# Patient Record
Sex: Male | Born: 1960
Health system: Southern US, Community
[De-identification: ages and names within clinical notes are randomized; demographics above are authoritative.]

## PROBLEM LIST (undated history)

## (undated) DIAGNOSIS — K5792 Diverticulitis of intestine, part unspecified, without perforation or abscess without bleeding: Secondary | ICD-10-CM

## (undated) HISTORY — PX: OTHER SURGICAL HISTORY: SHX169

---

## 2007-05-13 ENCOUNTER — Encounter: Admission: RE | Admit: 2007-05-13 | Discharge: 2007-05-13 | Payer: Self-pay | Admitting: General Surgery

## 2007-06-02 ENCOUNTER — Inpatient Hospital Stay (HOSPITAL_COMMUNITY): Admission: EM | Admit: 2007-06-02 | Discharge: 2007-06-04 | Payer: Self-pay | Admitting: Emergency Medicine

## 2009-04-29 IMAGING — CT CT PELVIS W/ CM
2 of 5 series · 17 of 46 positions shown, 19 images · IV contrast (READICAT/WATER & [ID] OMNI 300)
Comparison: none

Clinical data colon diverticulitis

CT abdomen with contrast:
Multidetector helical CT after 125  ml Bmnipaque-4JJ IV.
No previous for comparison. Minimal dependent atelectasis in the visualized lung
bases. Unremarkable liver, nondistended gallbladder, spleen, adrenal glands,
kidneys, pancreas, small bowel. No free air. No ascites. Portal vein patent.

[Series 3: routine abdomen · axial · 0.98mm/px · z∈[-435,+20]mm · 14 of 102 slices shown, 16 images]
[im 6/102  soft-tissue]
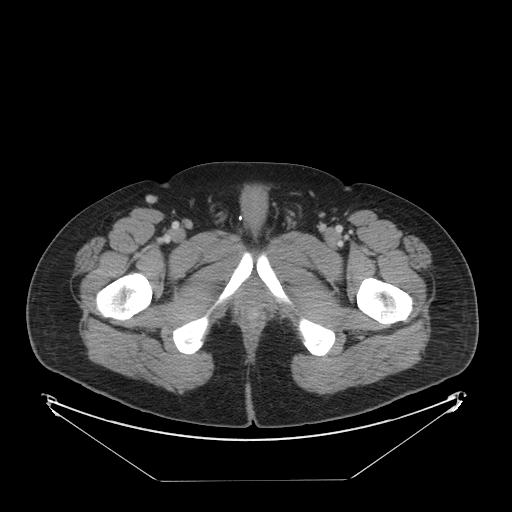
[im 6/102  bone]
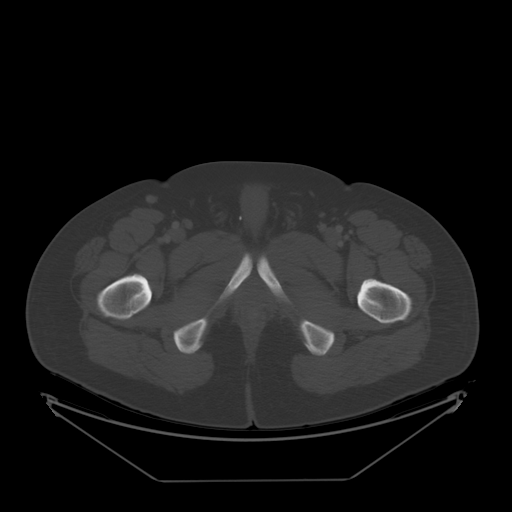
[im 11/102  soft-tissue]
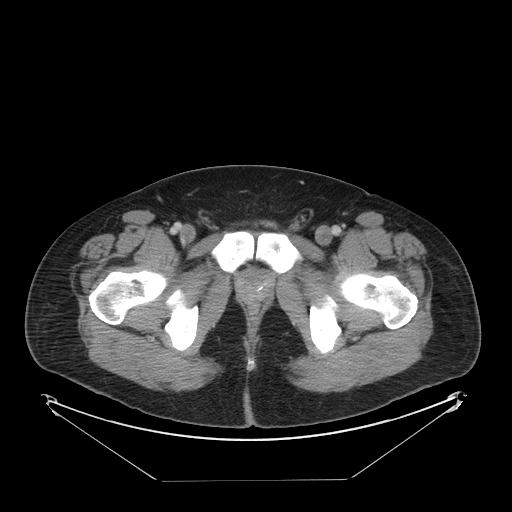
[im 22/102  soft-tissue]
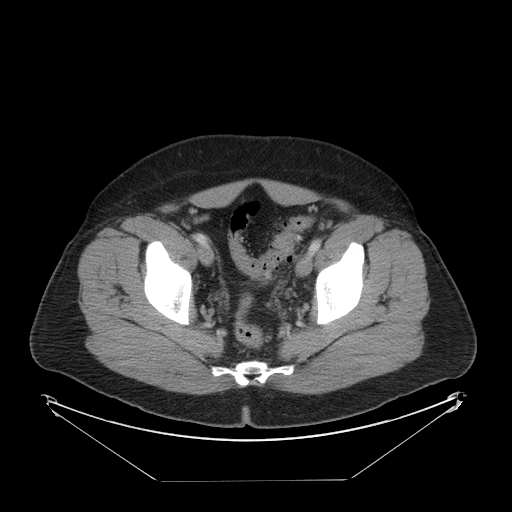
[im 27/102  soft-tissue]
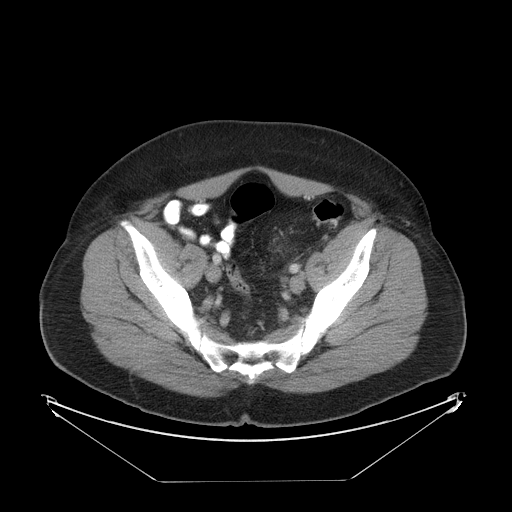
[im 32/102  soft-tissue]
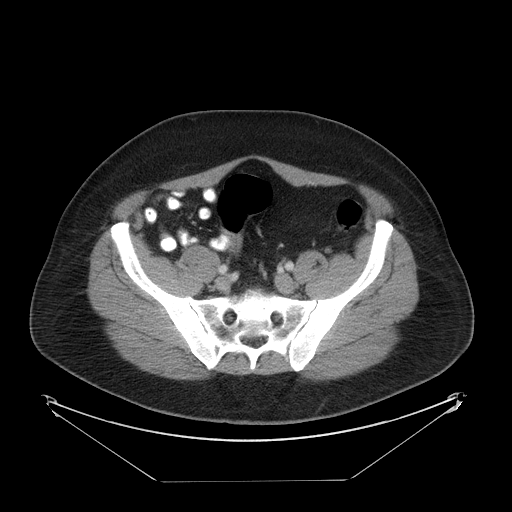
[im 43/102  soft-tissue]
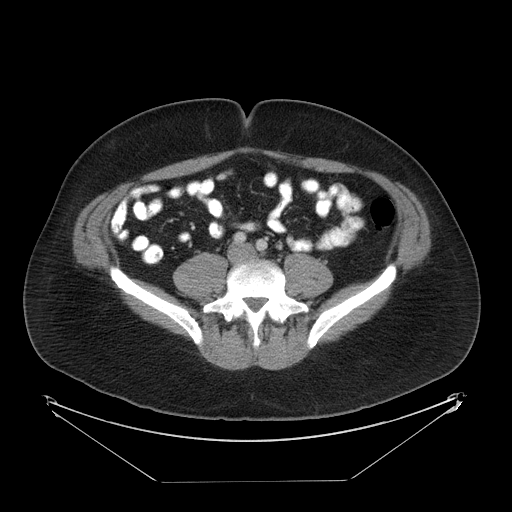
[im 48/102  soft-tissue]
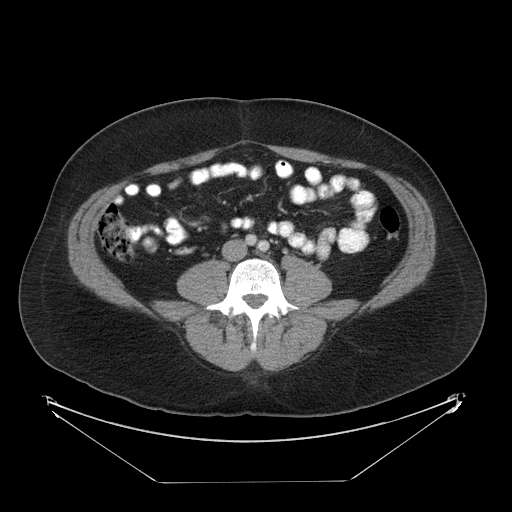
[im 54/102  soft-tissue]
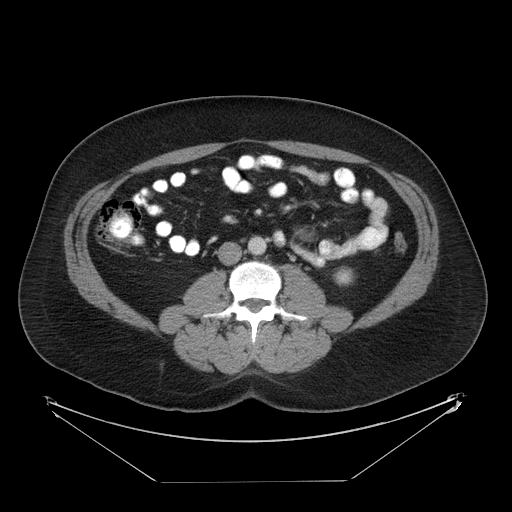
[im 59/102  soft-tissue]
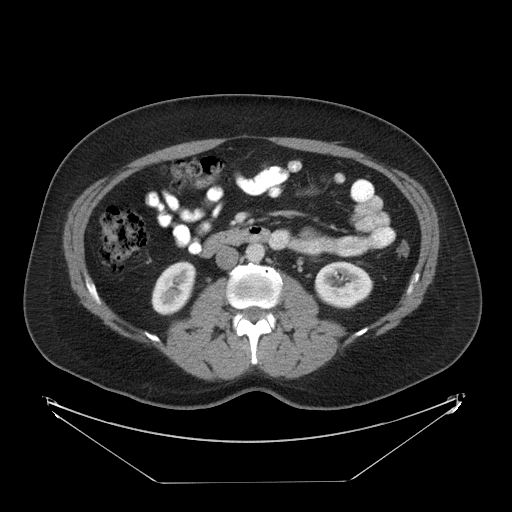
[im 59/102  bone]
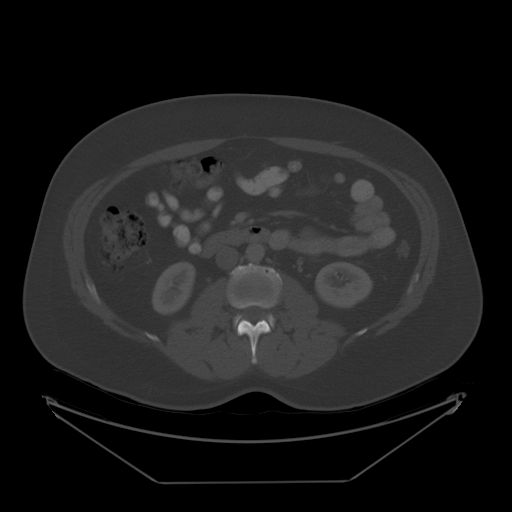
[im 70/102  soft-tissue]
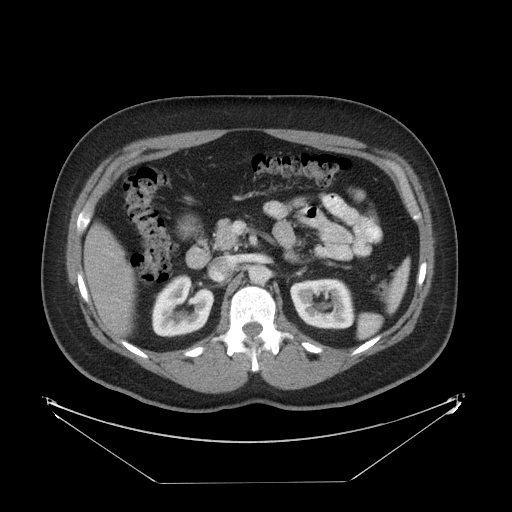
[im 75/102  soft-tissue]
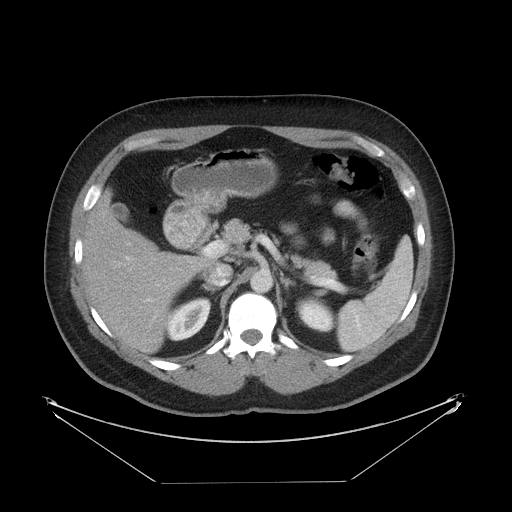
[im 80/102  soft-tissue]
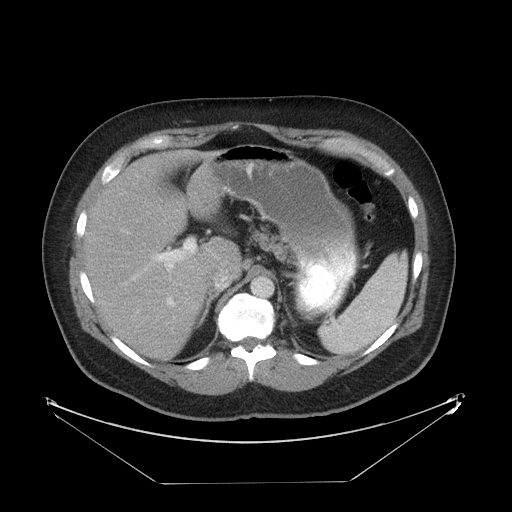
[im 91/102  soft-tissue]
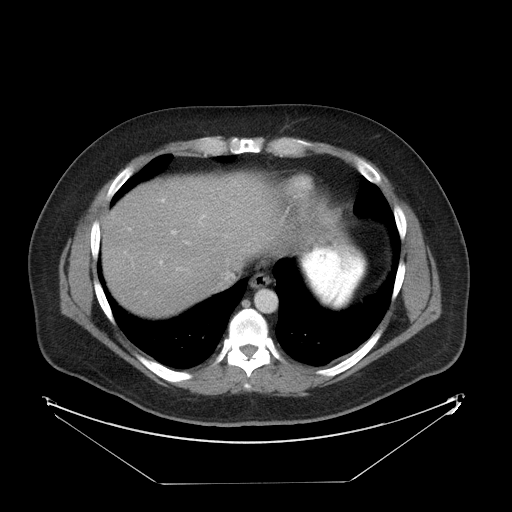
[im 96/102  soft-tissue]
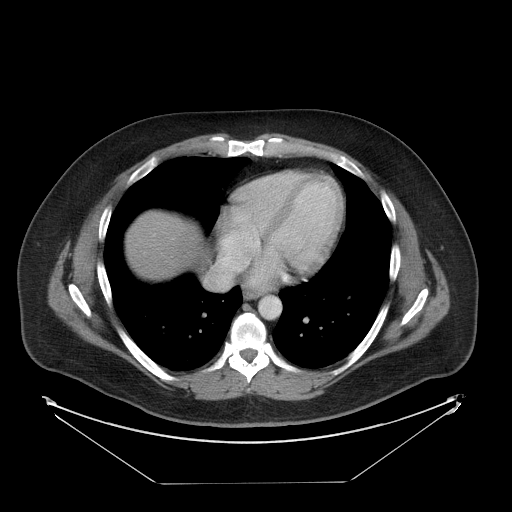

[Series 602: sagittal body · sagittal · 1.01mm/px · 3 of 190 slices shown]
[im 64/190  soft-tissue]
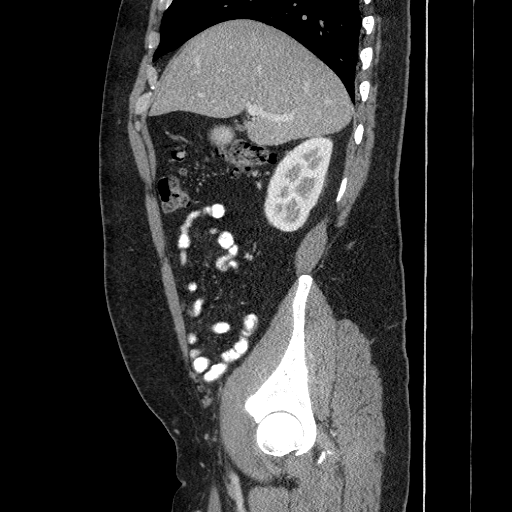
[im 85/190  soft-tissue]
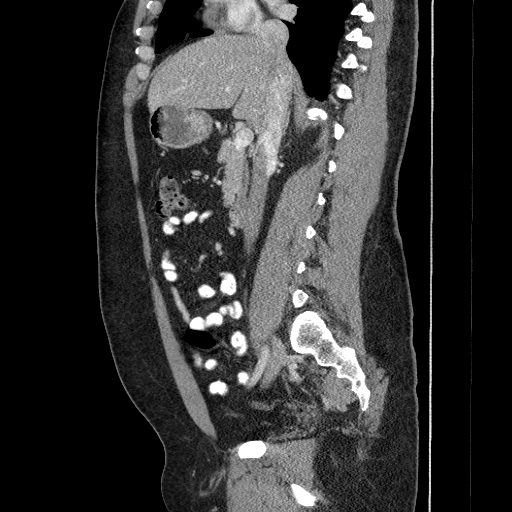
[im 106/190  soft-tissue]
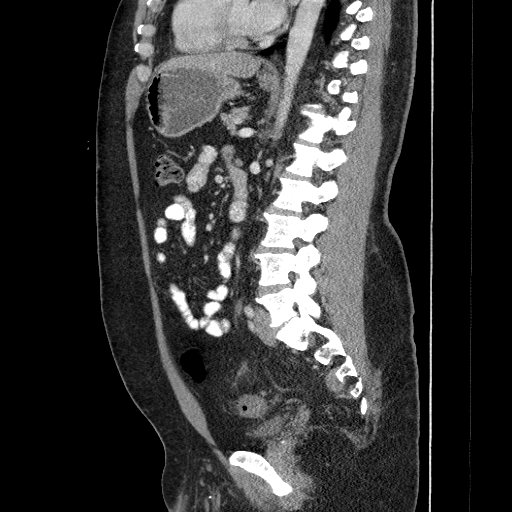

[17 of 46 positions shown; findings below may reference images not displayed]

IMPRESSION: 1. Unremarkable CT abdomen

CT pelvis with contrast:

Normal appendix.  The colon is nondilated. Scattered descending and sigmoid
diverticula. Mild inflammatory/edematous changes noted adjacent to the proximal
sigmoid colon. No extraluminal fluid collection to suggest abscess. No free
fluid. Urinary bladder incompletely distended. Bilateral pelvic phleboliths.
IMPRESSION: 1. Mild sigmoid diverticulitis without abscess.

## 2010-02-12 ENCOUNTER — Encounter: Admission: RE | Admit: 2010-02-12 | Discharge: 2010-02-12 | Payer: Self-pay | Admitting: Gastroenterology

## 2010-02-23 ENCOUNTER — Ambulatory Visit: Payer: Self-pay | Admitting: Internal Medicine

## 2010-02-23 DIAGNOSIS — M79609 Pain in unspecified limb: Secondary | ICD-10-CM | POA: Insufficient documentation

## 2010-02-23 DIAGNOSIS — M25519 Pain in unspecified shoulder: Secondary | ICD-10-CM | POA: Insufficient documentation

## 2010-02-23 DIAGNOSIS — K5730 Diverticulosis of large intestine without perforation or abscess without bleeding: Secondary | ICD-10-CM | POA: Insufficient documentation

## 2010-02-23 DIAGNOSIS — E669 Obesity, unspecified: Secondary | ICD-10-CM | POA: Insufficient documentation

## 2010-02-23 LAB — CONVERTED CEMR LAB

## 2010-02-26 ENCOUNTER — Ambulatory Visit: Payer: Self-pay | Admitting: Internal Medicine

## 2010-02-26 LAB — CONVERTED CEMR LAB
ALT: 24 units/L (ref 0–53)
Chloride: 106 meq/L (ref 96–112)
Cholesterol: 165 mg/dL (ref 0–200)
HDL: 41.6 mg/dL (ref >39.00)
LDL Cholesterol: 114 mg/dL — ABNORMAL HIGH (ref 0–99)
PSA: 0.45 ng/mL (ref 0.10–4.00)
Potassium: 4.2 meq/L (ref 3.5–5.1)
Sodium: 138 meq/L (ref 135–145)
TSH: 1.09 microintl units/mL (ref 0.35–5.50)
Total CHOL/HDL Ratio: 4
Triglycerides: 47 mg/dL (ref 0.0–149.0)

## 2010-07-29 ENCOUNTER — Encounter: Payer: Self-pay | Admitting: General Surgery

## 2010-08-07 NOTE — Assessment & Plan Note (Signed)
Summary: NEW PT TO EST/CLE   Vital Signs:  Patient profile:   50 year old male Height:      72 inches Weight:      267.75 pounds BMI:     36.44 Temp:     98.3 degrees F oral Pulse rate:   72 / minute Pulse rhythm:   regular BP sitting:   118 / 84  (left arm) Cuff size:   large  Vitals Entered By: Selena Batten Dance CMA Duncan Dull) (February 23, 2010 10:28 AM) CC: New pt. to establish care   History of Present Illness: CC: establish and shoulder pain  1. ?arthiritis - bilateral shoulder pain and left thumb pain.  Left thumb with nodule that was painful to touch and swollen.  Never red and warm.  Since abx feels joints better but has also taken it easy last few weeks.  No other joints involved.  2. diverticulitis - Dr. Kinnie Scales (research study group on diverticulitis).  Dr. Marguarite Arbour ([facility]).  recently had flareup 2 wks ago s/p cipro and flagyl and oxycodone.  feeling better.  Holding off on surgery for now.  scheduled for colonoscopy 04/2010.  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  Has been bitten x 3 this year by ticks.  Last one 60 days ago.  Doubts any on for longer than 24 hours.  No HA, fevers, chills, abd pain besides diverticulitis.  No muscle pains or rashes.  Never had PCP before, due for blood work.  declines DRE today.  has been checked in past.  thinks due for PSA.  Wife works at [employer].  Preventive Screening-Counseling & Management  Alcohol-Tobacco     Alcohol drinks/day: <1     Smoking Status: quit > 6 months  Caffeine-Diet-Exercise     Caffeine use/day: 2 cups coffee      Drug Use:  never.        Blood Transfusions:  no.    Colonoscopy  Procedure date:  07/23/2007  Findings:       Results: Diverticulosis and diverticulitis.  receives colonoscopies every 2 years in this study group on diverticulitis under Dr. Kinnie Scales with [facility].         Procedures Next Due Date:    Colonoscopy: 07/2009  -  Date:  02/19/2002    TD booster done per  pt  Current Medications (verified): 1)  Glucosamine-Chondroitin-Vit D3 1500-1200-800 Mg-Mg-Unit Pack (Glucosamine-Chondroitin-Vit D3) .... Once Daily or As Needed Arthritis  Allergies (verified): No Known Drug Allergies  Past History:  Past Medical History: Diverticulitis (in study group out of [facility] Dr. Kinnie Scales) Arthralgias h/o R RTC partial tear (1994) h/o L shoulder injury (2004)  Past Surgical History: T&A 1980s for sleep apnea (better since) Knee surgery R x 2 (1993)  Colonoscopy 07/2007 (diverticulitis/diverticulosis) (gets every 2 years)  Family History: Mother - BRCA 53yo (deceased) Uncles with prostate cancer GMA - emphysema ([employer]) 95s (deceased)  No HTN, DM, CA, CAD/MI, CVA  Social History: quit smoking occ EtOH, no rec drugs Occupation: Geophysical data processor Lives with wife and 2 dogs Grown children (3)Smoking Status:  quit > 6 months Caffeine use/day:  2 cups coffee Drug Use:  never Blood Transfusions:  no  Review of Systems       The patient complains of abdominal pain.  The patient denies anorexia, fever, weight loss, weight gain, vision loss, decreased hearing, hoarseness, chest pain, syncope, dyspnea on exertion, peripheral edema, prolonged cough, headaches, hemoptysis, melena, hematochezia, severe indigestion/heartburn, hematuria, incontinence, muscle weakness,  difficulty walking, depression, and testicular masses.    Physical Exam  General:  Well-developed,well-nourished,in no acute distress; alert,appropriate and cooperative throughout examination Head:  Normocephalic and atraumatic without obvious abnormalities. No apparent alopecia or balding. Eyes:  No corneal or conjunctival inflammation noted. EOMI. Perrla.  Ears:  External ear exam shows no significant lesions or deformities.  Otoscopic examination reveals clear canals, tympanic membranes are intact bilaterally without bulging, retraction, inflammation or discharge. Hearing is  grossly normal bilaterally. Nose:  External nasal examination shows no deformity or inflammation. Nasal mucosa are pink and moist without lesions or exudates. Mouth:  Oral mucosa and oropharynx without lesions or exudates.  Teeth in good repair. Neck:  No deformities, masses, or tenderness noted.  no bruits Lungs:  Normal respiratory effort, chest expands symmetrically. Lungs are clear to auscultation, no crackles or wheezes. Heart:  Normal rate and regular rhythm. S1 and S2 normal without gallop, murmur, click, rub or other extra sounds. Abdomen:  Bowel sounds positive,abdomen soft and non-tender without masses, organomegaly or hernias noted. Msk:  No neck pain.  Shoulders:  no deformity or tenderness on palpation.  bilateral FROM passive and against resistance.  R with tenderness to ext rotation, L no tenderness on testing of SITS.    L thumb - small nodule lateral MCP joint, nontender, not swollen, not red or warm.  negative finklestein's.  + popping at base of thumb/wrist when opposing thumb to pinky finger and pain. Pulses:  2+ periph pulses Extremities:  No clubbing, cyanosis, edema, or deformity noted with normal full range of motion of all joints.   Skin:  Intact without suspicious lesions or rashes   Impression & Recommendations:  Problem # 1:  PAIN IN JOINT, SHOULDER REGION (ICD-719.41) possible RTC strain/injury vs arthritis.  Provided with handout today, rec ibuprofen for symptomatic relief, provided with RTC strengthening exercises to do and shown how to do.  RTC if no improvement and consider imaging vs PT.  has had injury to both shoulders in past.  Problem # 2:  THUMB PAIN, LEFT (ICD-729.5) small nodule palpated.  given significant pain and sweling when had flare, check UA level to eval gout.  alternatively could be ganglion cyst vs other cyst vs ?rheumatoid arthritis.  not in location of typical trigger finger nodules.  negative finklestein's today.  Problem # 3:  OBESITY  (ICD-278.00) blood work.   Ht: 72 (02/23/2010)   Wt: 267.75 (02/23/2010)   BMI: 36.44 (02/23/2010)  Problem # 4:  DIVERTICULAR DISEASE (ICD-562.10) stable currently.  followed by [facility].  monitor for now.  knows to call Dr. Kinnie Scales when first sxs of flare occur.  scheduled colonoscopy for 04/2010.  Problem # 5:  SPECIAL SCREENING MALIGNANT NEOPLASM OF PROSTATE (ICD-V76.44) check PSA.  declined DRE today.  states had one within the year.  Complete Medication List: 1)  Glucosamine-chondroitin-vit D3 1500-1200-800 Mg-mg-unit Pack (Glucosamine-chondroitin-vit d3) .... Once daily or as needed arthritis  Patient Instructions: 1)  Shoulder exercises to help strengthen rotator cuffs. 2)  Ibuprofen/tylenol as needed for pain. 3)  Fasting blood work next week [CMP, FLP, uric acid, TSH 729.5, 278.00]  PSA V76.44 4)  Well keep an eye on the thumb pain, if returns please return. 5)  Pleasure to meet you today.  Call clinic with questions.  Prior Medications: Current Allergies (reviewed today): No known allergies    Prevention & Chronic Care Immunizations   Influenza vaccine: Not documented    Tetanus booster: 02/19/2002: done per pt   Tetanus booster  due: 02/20/2012    Pneumococcal vaccine: Not documented  Colorectal Screening   Hemoccult: Not documented    Colonoscopy:  Results: Diverticulosis and diverticulitis.  receives colonoscopies every 2 years in this study group on diverticulitis under Dr. Kinnie Scales with [facility].         (07/23/2007)   Colonoscopy due: 07/2009  Other Screening   PSA: Not documented   PSA action/deferral: Discussed-PSA requested  (02/23/2010)   Smoking status: quit > 6 months  (02/23/2010)  Lipids   Total Cholesterol: Not documented   LDL: Not documented   LDL Direct: Not documented   HDL: Not documented   Triglycerides: Not documented

## 2010-11-20 NOTE — H&P (Signed)
William Kirby, ORTH NO.:  [id number]   MEDICAL RECORD NO.:  [id number]          PATIENT TYPE:  EMS   LOCATION:  [facility]                         FACILITY:  [facility]   PHYSICIAN:  Thomas A. Cornett, M.D.DATE OF BIRTH:  02-05-1961   DATE OF ADMISSION:  06/02/2007  DATE OF DISCHARGE:                              HISTORY & PHYSICAL   CHIEF COMPLAINT:  Left lower quadrant pain.   HISTORY OF PRESENT ILLNESS:  The patient is a 50 year old male with a 6-  week history of off-and-on left lower quadrant pain.  He has been  treated as an outpatient for diverticulitis with p.o. antibiotics.  The  pain gets better on the antibiotics, and then once he stops the  antibiotics the pain returns.  He has had increasing pain which has now  become severe since  Friday.  The pain is associated with no significant nausea and vomiting,  but he has had a decreased appetite.  No obvious blood in his stool.  He  is followed by Dr. Carolynne Edouard in our clinic for acute diverticulitis.  Unfortunately, his pain has recurred on p.o. antibiotics, and he is  being seen today in the emergency room.  CT scan was obtained which  shows acute diverticulitis without any complicating factors.   PAST MEDICAL HISTORY:  Diverticulosis, with history of diverticulitis,  at least four attacks.   PAST SURGICAL HISTORY:  None.   ALLERGIES:  None.   MEDICATIONS:  He has been on and off Augmentin, Cipro, and Flagyl, it  looks like, by his record.   FAMILY HISTORY:  Negative.   SOCIAL HISTORY:  Denies tobacco or alcohol use.  He is married.   REVIEW OF SYSTEMS:  Negative x15.   PHYSICAL EXAMINATION:  VITAL SIGNS:  He has no fever today.  His vital  signs were stable.  HEENT: Extraocular movements are intact.  Oropharynx clear.  NECK:  Supple, nontender.  Trachea midline.  CHEST:  Lungs clear to auscultation.  Chest wall motion normal.  CARDIOVASCULAR:  Regular rate and rhythm, without rub, murmur or gallop.  ABDOMEN:  Tender in the left lower quadrant.  No diffuse peritonitis.  No mass.  EXTREMITIES:  No clubbing, cyanosis, or edema.  NEUROLOGIC: Alert and oriented x4.  Glasgow Coma Scale is 15.   DIAGNOSTIC STUDIES:  White count is 11,600, without left shift.  Hemoglobin and hematocrit are normal.  Electrolytes normal.  CT scan of  the abdomen and pelvis shows acute diverticulitis, without abscess,  fistula, or free air.   IMPRESSION:  Acute diverticulitis, recurrent, despite p.o. antibiotics.   PLAN:  Will admit for IV fluids, IV antibiotics, and n.p.o. status.  I  have discussed the case with Dr. Carolynne Edouard.  He will assume his care in the  morning.  This was also discussed with the patient and his wife.      Thomas A. Cornett, M.D.  Electronically Signed     TAC/MEDQ  D:  06/02/2007  T:  06/03/2007  Job:  [id number]

## 2010-11-20 NOTE — Discharge Summary (Signed)
NAMELADEN, FIELDHOUSE NO.:  [id number]   MEDICAL RECORD NO.:  [id number]          PATIENT TYPE:  INP   LOCATION:  5707                         FACILITY:  [facility]   PHYSICIAN:  Lennie Muckle, MD      DATE OF BIRTH:  03/09/1961   DATE OF ADMISSION:  06/02/2007  DATE OF DISCHARGE:  06/04/2007                               DISCHARGE SUMMARY   DIAGNOSIS:  Diverticulitis.   HISTORY OF PRESENT ILLNESS:  Mr. Eplin is a 50 year old male who was  admitted due to increasing abdominal pain.  Was on Avelox at home for  diverticulitis, but due to continuation of his abdominal pain, it was  thought he would fair better with IV antibiotics.  He was brought in,  placed on Zosyn, received oral pain pills, and on the day of discharge  had minimum amounts of pain.  Abdomen was soft and nontender.  He is  tolerating a liquid diet.   DISCHARGE MEDICATIONS AND INSTRUCTIONS:  He was changed to Cipro 500 mg  b.i.d. and Flagyl 500 mg 3 times a day.  Will follow up with Dr. Carolynne Edouard in  one to two weeks.  Will call if he develops fever or chills, or increase  in abdominal pain.  He was also given a prescription for Percocet 5/325  to aide with his pain.  There was a CT scan performed, which did reveal  a mass-like lesion in the colon.  He will follow up with an outpatient  colonoscopy once his diverticulitis has resolved for further evaluation  of inflammatory changes versus a carcinoma.      Lennie Muckle, MD  Electronically Signed     ALA/MEDQ  D:  06/04/2007  T:  06/04/2007  Job:  [id number]

## 2011-04-16 LAB — BASIC METABOLIC PANEL
BUN: 9
Calcium: 8.3 — ABNORMAL LOW
Calcium: 8.8
Chloride: 104
Creatinine, Ser: 1.27
GFR calc Af Amer: >60
GFR calc Af Amer: >60
GFR calc non Af Amer: >60
GFR calc non Af Amer: >60
Sodium: 134 — ABNORMAL LOW

## 2011-04-16 LAB — CBC
HCT: 35.6 — ABNORMAL LOW
Hemoglobin: 12.4 — ABNORMAL LOW
Hemoglobin: 12.6 — ABNORMAL LOW
Hemoglobin: 13.8
MCHC: 34.7
MCV: 88.9
MCV: 90.1
Platelets: 187
Platelets: 215
RDW: 12.6
RDW: 12.8

## 2011-04-16 LAB — DIFFERENTIAL
Basophils Absolute: 0
Eosinophils Absolute: 0.2
Eosinophils Relative: 2
Lymphs Abs: 1.9
Monocytes Absolute: 1.1 — ABNORMAL HIGH
Neutro Abs: 7.7
Neutrophils Relative %: 59
Neutrophils Relative %: 68

## 2012-01-21 ENCOUNTER — Encounter (HOSPITAL_COMMUNITY): Payer: Self-pay | Admitting: Emergency Medicine

## 2012-01-21 ENCOUNTER — Emergency Department (HOSPITAL_COMMUNITY): Payer: BC Managed Care – PPO

## 2012-01-21 ENCOUNTER — Emergency Department (HOSPITAL_COMMUNITY)
Admission: EM | Admit: 2012-01-21 | Discharge: 2012-01-21 | Disposition: A | Discharge status: Home / Self Care | Payer: BC Managed Care – PPO | Attending: Emergency Medicine | Admitting: Emergency Medicine

## 2012-01-21 DIAGNOSIS — Z79899 Other long term (current) drug therapy: Secondary | ICD-10-CM | POA: Insufficient documentation

## 2012-01-21 DIAGNOSIS — R1032 Left lower quadrant pain: Secondary | ICD-10-CM | POA: Insufficient documentation

## 2012-01-21 DIAGNOSIS — K5792 Diverticulitis of intestine, part unspecified, without perforation or abscess without bleeding: Secondary | ICD-10-CM

## 2012-01-21 DIAGNOSIS — K5730 Diverticulosis of large intestine without perforation or abscess without bleeding: Secondary | ICD-10-CM

## 2012-01-21 DIAGNOSIS — R509 Fever, unspecified: Secondary | ICD-10-CM | POA: Insufficient documentation

## 2012-01-21 DIAGNOSIS — K5732 Diverticulitis of large intestine without perforation or abscess without bleeding: Secondary | ICD-10-CM | POA: Insufficient documentation

## 2012-01-21 HISTORY — DX: Diverticulitis of intestine, part unspecified, without perforation or abscess without bleeding: K57.92

## 2012-01-21 LAB — CBC WITH DIFFERENTIAL/PLATELET
Basophils Absolute: 0 10*3/uL (ref 0.0–0.1)
Eosinophils Absolute: 0.3 10*3/uL (ref 0.0–0.7)
Eosinophils Relative: 3 % (ref 0–5)
HCT: 40.1 % (ref 39.0–52.0)
Lymphs Abs: 2.8 10*3/uL (ref 0.7–4.0)
MCHC: 35.4 g/dL (ref 30.0–36.0)
MCV: 87.6 fL (ref 78.0–100.0)
RBC: 4.58 MIL/uL (ref 4.22–5.81)
WBC: 12.8 10*3/uL — ABNORMAL HIGH (ref 4.0–10.5)

## 2012-01-21 LAB — URINALYSIS, ROUTINE W REFLEX MICROSCOPIC
Hgb urine dipstick: NEGATIVE
Ketones, ur: NEGATIVE mg/dL
Leukocytes, UA: NEGATIVE
Nitrite: NEGATIVE
Protein, ur: NEGATIVE mg/dL
Specific Gravity, Urine: 1.019 (ref 1.005–1.030)
Urobilinogen, UA: 0.2 mg/dL (ref 0.0–1.0)

## 2012-01-21 LAB — LIPASE, BLOOD: Lipase: 32 U/L (ref 11–59)

## 2012-01-21 LAB — COMPREHENSIVE METABOLIC PANEL
ALT: 15 U/L (ref 0–53)
AST: 14 U/L (ref 0–37)
Albumin: 3.9 g/dL (ref 3.5–5.2)
Alkaline Phosphatase: 80 U/L (ref 39–117)
Calcium: 9.2 mg/dL (ref 8.4–10.5)
Chloride: 101 mEq/L (ref 96–112)
GFR calc non Af Amer: >90 mL/min (ref >90)
Glucose, Bld: 120 mg/dL — ABNORMAL HIGH (ref 70–99)
Total Protein: 7.3 g/dL (ref 6.0–8.3)

## 2012-01-21 MED ORDER — HYDROCODONE-ACETAMINOPHEN 5-325 MG PO TABS: 2.0000 | ORAL_TABLET | ORAL | Status: AC | PRN

## 2012-01-21 MED ORDER — MORPHINE SULFATE 4 MG/ML IJ SOLN
4.0000 mg | Freq: Once | INTRAMUSCULAR | Status: AC
  Administered 2012-01-21: 4 mg via INTRAVENOUS
  Filled 2012-01-21: qty 1

## 2012-01-21 MED ORDER — IOHEXOL 300 MG/ML  SOLN
100.0000 mL | Freq: Once | INTRAMUSCULAR | Status: AC | PRN
  Administered 2012-01-21: 100 mL via INTRAVENOUS

## 2012-01-21 MED ORDER — SODIUM CHLORIDE 0.9 % IV SOLN
Freq: Once | INTRAVENOUS | Status: AC
  Administered 2012-01-21: 100 mL via INTRAVENOUS

## 2012-01-21 MED ORDER — ONDANSETRON HCL 4 MG/2ML IJ SOLN
4.0000 mg | Freq: Once | INTRAMUSCULAR | Status: AC
  Administered 2012-01-21: 4 mg via INTRAVENOUS
  Filled 2012-01-21: qty 2

## 2012-01-21 MED ORDER — SODIUM CHLORIDE 0.9 % IV BOLUS (SEPSIS)
1000.0000 mL | Freq: Once | INTRAVENOUS | Status: AC
  Administered 2012-01-21: 1000 mL via INTRAVENOUS

## 2012-01-21 MED ORDER — METRONIDAZOLE 500 MG PO TABS: 500.0000 mg | ORAL_TABLET | Freq: Three times a day (TID) | ORAL | Status: AC

## 2012-01-21 MED ORDER — CIPROFLOXACIN HCL 500 MG PO TABS: 500.0000 mg | ORAL_TABLET | Freq: Two times a day (BID) | ORAL | Status: AC

## 2012-01-21 NOTE — ED Notes (Signed)
Orders called in by Dr Abbey Chatters

## 2012-01-21 NOTE — ED Notes (Signed)
Pt presents this am with c/o left lower quadrant pain that started yesterday afternoon   Pt states he has hx of diverticulitis  Pt states he had an episode on Sunday July 5th but it passed  Pt states this pain feels different to him  Pt states he has nausea without vomiting or diarrhea  Pt states he has not really eaten anything since lunch yesterday

## 2012-01-21 NOTE — ED Provider Notes (Signed)
History     CSN: [id number]  Arrival date & time 01/21/12  4098   First MD Initiated Contact with Patient 01/21/12 [phone]      Chief Complaint  Patient presents with  . Abdominal Pain    (Consider location/radiation/quality/duration/timing/severity/associated sxs/prior treatment) HPI  51yoM h/o diverticulosis, multiple episodes of diverticulitis pw abdominal pain. States that it has been constant since yesterday, left sided (LUQ/LLQ) and aching. 5/10 at this time. Denies N/V/constipation/diarrhea. Last BM yesterday was nl, non bloody. Denies back pain. No testicular pain. Has not taken anything for pain prior to arrival. His wife works for [employer] and they called ahead with orders for labs, CT A/P eval diverticulitis and surgery consult prior to my evaluation.  Denies h/o abdominal surgeries   ED Notes, ED Provider Notes from 01/21/12 0000 to 01/21/12 06:54:21       Orland Penman, RN 01/21/2012 06:49      Pt presents this am with c/o left lower quadrant pain that started yesterday afternoon Pt states he has hx of diverticulitis Pt states he had an episode on Sunday July 5th but it passed Pt states this pain feels different to him Pt states he has nausea without vomiting or diarrhea Pt states he has not really eaten anything since lunch yesterday         Tammy Clois Comber, RN 01/21/2012 06:45      Orders called in by Dr Abbey Chatters     Past Medical History  Diagnosis Date  . Diverticulitis     Past Surgical History  Procedure Date  . Knee surgery x 2   . Sleep apnea surgery     Family History  Problem Relation Age of Onset  . Cancer Other     History  Substance Use Topics  . Smoking status: Never Smoker   . Smokeless tobacco: Not on file  . Alcohol Use: Yes     social    Review of Systems  All other systems reviewed and are negative.   except as noted HPI   Allergies  Percocet  Home Medications   Current Outpatient Rx  Name Route  Sig Dispense Refill  . ACETAMINOPHEN 500 MG PO TABS Oral Take 500 mg by mouth every 6 (six) hours as needed.    Marland Kitchen CIPROFLOXACIN HCL 500 MG PO TABS Oral Take 1 tablet (500 mg total) by mouth 2 (two) times daily. 20 tablet 2  . HYDROCODONE-ACETAMINOPHEN 5-325 MG PO TABS Oral Take 2 tablets by mouth every 4 (four) hours as needed for pain. 10 tablet 0  . METRONIDAZOLE 500 MG PO TABS Oral Take 1 tablet (500 mg total) by mouth 3 (three) times daily. 30 tablet 2    BP 125/80  Pulse 84  Temp 98.7 F (37.1 C) (Oral)  Resp 20  SpO2 98%  Physical Exam  Nursing note and vitals reviewed. Constitutional: He is oriented to person, place, and time. He appears well-developed and well-nourished. No distress.  HENT:  Head: Atraumatic.  Mouth/Throat: Oropharynx is clear and moist.  Eyes: Conjunctivae are normal. Pupils are equal, round, and reactive to light.  Neck: Neck supple.  Cardiovascular: Normal rate, regular rhythm, normal heart sounds and intact distal pulses.  Exam reveals no gallop and no friction rub.   No murmur heard. Pulmonary/Chest: Effort normal. No respiratory distress. He has no wheezes. He has no rales.  Abdominal: Soft. Bowel sounds are normal. He exhibits no distension. There is tenderness. There is no rebound and no  guarding.       LUQ/LLQ  Genitourinary:       No groin ttp No testicular erythema, ttp  Musculoskeletal: Normal range of motion. He exhibits no edema and no tenderness.  Neurological: He is alert and oriented to person, place, and time.  Skin: Skin is warm and dry.  Psychiatric: He has a normal mood and affect.    ED Course  Procedures (including critical care time)  Labs Reviewed  CBC WITH DIFFERENTIAL - Abnormal; Notable for the following:    WBC 12.8 (*)     Neutro Abs 8.4 (*)     Monocytes Absolute 1.3 (*)     All other components within normal limits  COMPREHENSIVE METABOLIC PANEL - Abnormal; Notable for the following:    Glucose, Bld 120 (*)      All other components within normal limits  URINALYSIS, ROUTINE W REFLEX MICROSCOPIC  LIPASE, BLOOD   Ct Abdomen Pelvis W Contrast  01/21/2012  *RADIOLOGY REPORT*  Clinical Data: Left lower quadrant abdominal pain.  Fever.  CT ABDOMEN AND PELVIS WITH CONTRAST  Technique:  Multidetector CT imaging of the abdomen and pelvis was performed following the standard protocol during bolus administration of intravenous contrast.  Contrast: OMNIPAQUE IOHEXOL 300 MG/ML  SOLN  Comparison: 02/12/2010  Findings: There is focal diverticulitis in the distal descending colon with slight pericolonic soft tissue inflammation and a tiny amount of fluid in the pericolic gutter.  There is no perforation or abscess.  There are numerous diverticula throughout the colon.  Terminal ileum and appendix are normal.  Liver, spleen, pancreas, adrenal glands, and kidneys are normal except for what may be a 1 mm stone in the lower pole of the left kidney.  No adenopathy.  No osseous abnormality.  IMPRESSION: Acute focal diverticulitis of the distal descending colon, best seen on image number 61 of series 2.  Numerous diverticula throughout the colon.  Original Report Authenticated By: Gwynn Burly, M.D.   1. DIVERTICULAR DISEASE   2. Diverticulitis    MDM  PW LLQ abdominal pain found to have diverticulitis without complication. Appears well, afebrile. Tolerating PO and will do well with PO abx at home. Eval by surgery in ED arranged prior to arrival-- wrote prescriptions for cipro and flagyl. Will write for norco. No EMC precluding discharge at this time. Given Precautions for return. PMD f/u.         Forbes Cellar, MD 01/21/12 [phone]

## 2012-01-21 NOTE — ED Notes (Signed)
Dr Daphine Deutscher paged regarding Dr Abbey Chatters order to notify when pt arrived

## 2012-04-23 ENCOUNTER — Other Ambulatory Visit (INDEPENDENT_AMBULATORY_CARE_PROVIDER_SITE_OTHER): Payer: Self-pay | Admitting: Surgery

## 2012-04-23 DIAGNOSIS — Z8719 Personal history of other diseases of the digestive system: Secondary | ICD-10-CM

## 2012-05-16 ENCOUNTER — Emergency Department: Payer: Self-pay | Admitting: Internal Medicine

## 2013-01-27 ENCOUNTER — Encounter (INDEPENDENT_AMBULATORY_CARE_PROVIDER_SITE_OTHER): Payer: Self-pay | Admitting: Surgery

## 2013-01-27 ENCOUNTER — Telehealth (INDEPENDENT_AMBULATORY_CARE_PROVIDER_SITE_OTHER): Payer: Self-pay | Admitting: General Surgery

## 2013-01-27 NOTE — Telephone Encounter (Signed)
Patient wife came into the nurses office and stated that her husband is having a flare up of his Diverticulitis and wanted Dr Daphine Deutscher paged to see if Dr Daphine Deutscher will call in [place] some Cipro and Flagyl and something for pain. I called Rx into the [facility] in [place] [phone] and spoke to [facility] at the [facility] Called in Cipro 500 mg BID #30 and Flagyl 500 mg tid # 30 and called in Vicodin 5/325 1-2 tabs po q 4 to 6 hrs for pain # 30 with out refills on all 3 meds. Per Dr Daphine Deutscher patient will need to be on a clear and liquid diet. If the patient has not gotten any better that patient will need to call the office or tell his wife Alvino Chapel ) to call Dr Daphine Deutscher

## 2013-11-29 ENCOUNTER — Inpatient Hospital Stay (HOSPITAL_COMMUNITY)
Admission: EM | Admit: 2013-11-29 | Discharge: 2013-12-01 | DRG: 392 | Disposition: A | Discharge status: Home / Self Care | Payer: No Typology Code available for payment source | Attending: General Surgery | Admitting: General Surgery

## 2013-11-29 ENCOUNTER — Emergency Department (HOSPITAL_COMMUNITY): Payer: No Typology Code available for payment source

## 2013-11-29 ENCOUNTER — Encounter (HOSPITAL_COMMUNITY): Payer: Self-pay | Admitting: Emergency Medicine

## 2013-11-29 DIAGNOSIS — R1032 Left lower quadrant pain: Secondary | ICD-10-CM | POA: Diagnosis not present

## 2013-11-29 DIAGNOSIS — K5732 Diverticulitis of large intestine without perforation or abscess without bleeding: Secondary | ICD-10-CM | POA: Diagnosis not present

## 2013-11-29 DIAGNOSIS — K5792 Diverticulitis of intestine, part unspecified, without perforation or abscess without bleeding: Secondary | ICD-10-CM | POA: Diagnosis present

## 2013-11-29 DIAGNOSIS — Z791 Long term (current) use of non-steroidal anti-inflammatories (NSAID): Secondary | ICD-10-CM

## 2013-11-29 DIAGNOSIS — Z79899 Other long term (current) drug therapy: Secondary | ICD-10-CM

## 2013-11-29 DIAGNOSIS — N200 Calculus of kidney: Secondary | ICD-10-CM | POA: Diagnosis present

## 2013-11-29 DIAGNOSIS — Z888 Allergy status to other drugs, medicaments and biological substances status: Secondary | ICD-10-CM

## 2013-11-29 LAB — COMPREHENSIVE METABOLIC PANEL
ALT: 15 U/L (ref 0–53)
AST: 16 U/L (ref 0–37)
Albumin: 3.7 g/dL (ref 3.5–5.2)
Alkaline Phosphatase: 91 U/L (ref 39–117)
BUN: 12 mg/dL (ref 6–23)
CALCIUM: 8.9 mg/dL (ref 8.4–10.5)
CO2: 23 mEq/L (ref 19–32)
CREATININE: 0.95 mg/dL (ref 0.50–1.35)
Chloride: 102 mEq/L (ref 96–112)
GLUCOSE: 114 mg/dL — AB (ref 70–99)
Potassium: 3.9 mEq/L (ref 3.7–5.3)
SODIUM: 139 meq/L (ref 137–147)
TOTAL PROTEIN: 7.1 g/dL (ref 6.0–8.3)
Total Bilirubin: 0.9 mg/dL (ref 0.3–1.2)

## 2013-11-29 LAB — CBC WITH DIFFERENTIAL/PLATELET
Basophils Absolute: 0 10*3/uL (ref 0.0–0.1)
Basophils Relative: 0 % (ref 0–1)
EOS ABS: 0.2 10*3/uL (ref 0.0–0.7)
EOS PCT: 2 % (ref 0–5)
HEMATOCRIT: 38.8 % — AB (ref 39.0–52.0)
Hemoglobin: 13.4 g/dL (ref 13.0–17.0)
LYMPHS ABS: 2.1 10*3/uL (ref 0.7–4.0)
Lymphocytes Relative: 19 % (ref 12–46)
MCH: 30.1 pg (ref 26.0–34.0)
MCHC: 34.5 g/dL (ref 30.0–36.0)
MCV: 87.2 fL (ref 78.0–100.0)
MONO ABS: 1.1 10*3/uL — AB (ref 0.1–1.0)
MONOS PCT: 10 % (ref 3–12)
Neutro Abs: 7.8 10*3/uL — ABNORMAL HIGH (ref 1.7–7.7)
Neutrophils Relative %: 69 % (ref 43–77)
PLATELETS: 205 10*3/uL (ref 150–400)
RBC: 4.45 MIL/uL (ref 4.22–5.81)
RDW: 12.4 % (ref 11.5–15.5)
WBC: 11.2 10*3/uL — ABNORMAL HIGH (ref 4.0–10.5)

## 2013-11-29 LAB — URINALYSIS, ROUTINE W REFLEX MICROSCOPIC
Bilirubin Urine: NEGATIVE
Glucose, UA: NEGATIVE mg/dL
Hgb urine dipstick: NEGATIVE
Ketones, ur: NEGATIVE mg/dL
LEUKOCYTES UA: NEGATIVE
NITRITE: NEGATIVE
PH: 5.5 (ref 5.0–8.0)
Protein, ur: NEGATIVE mg/dL
SPECIFIC GRAVITY, URINE: 1.014 (ref 1.005–1.030)
UROBILINOGEN UA: 0.2 mg/dL (ref 0.0–1.0)

## 2013-11-29 MED ORDER — KCL IN DEXTROSE-NACL 20-5-0.9 MEQ/L-%-% IV SOLN
INTRAVENOUS | Status: DC
  Administered 2013-11-29 – 2013-11-30 (×3): via INTRAVENOUS
  Filled 2013-11-29 (×4): qty 1000

## 2013-11-29 MED ORDER — ONDANSETRON HCL 4 MG/2ML IJ SOLN
4.0000 mg | Freq: Once | INTRAMUSCULAR | Status: AC
  Administered 2013-11-29: 4 mg via INTRAVENOUS
  Filled 2013-11-29: qty 2

## 2013-11-29 MED ORDER — MORPHINE SULFATE 4 MG/ML IJ SOLN
4.0000 mg | Freq: Once | INTRAMUSCULAR | Status: AC
Start: 2013-11-29 — End: 2013-11-29
  Administered 2013-11-29: 4 mg via INTRAVENOUS
  Filled 2013-11-29: qty 1

## 2013-11-29 MED ORDER — MORPHINE SULFATE 4 MG/ML IJ SOLN
4.0000 mg | Freq: Once | INTRAMUSCULAR | Status: AC
  Administered 2013-11-29: 4 mg via INTRAVENOUS
  Filled 2013-11-29: qty 1

## 2013-11-29 MED ORDER — METRONIDAZOLE IN NACL 5-0.79 MG/ML-% IV SOLN
500.0000 mg | Freq: Three times a day (TID) | INTRAVENOUS | Status: DC
  Administered 2013-11-29 – 2013-12-01 (×6): 500 mg via INTRAVENOUS
  Filled 2013-11-29 (×8): qty 100

## 2013-11-29 MED ORDER — PANTOPRAZOLE SODIUM 40 MG IV SOLR
40.0000 mg | Freq: Every day | INTRAVENOUS | Status: DC
  Administered 2013-11-29 – 2013-11-30 (×2): 40 mg via INTRAVENOUS
  Filled 2013-11-29 (×3): qty 40

## 2013-11-29 MED ORDER — IOHEXOL 300 MG/ML  SOLN: 100.0000 mL | Freq: Once | INTRAMUSCULAR | Status: DC | PRN

## 2013-11-29 MED ORDER — ONDANSETRON HCL 4 MG/2ML IJ SOLN: 4.0000 mg | Freq: Four times a day (QID) | INTRAMUSCULAR | Status: DC | PRN

## 2013-11-29 MED ORDER — CIPROFLOXACIN IN D5W 400 MG/200ML IV SOLN
400.0000 mg | Freq: Two times a day (BID) | INTRAVENOUS | Status: DC
Start: 2013-11-29 — End: 2013-12-01
  Administered 2013-11-29 – 2013-12-01 (×4): 400 mg via INTRAVENOUS
  Filled 2013-11-29 (×5): qty 200

## 2013-11-29 MED ORDER — MORPHINE SULFATE 2 MG/ML IJ SOLN
2.0000 mg | INTRAMUSCULAR | Status: DC | PRN
  Administered 2013-11-29 (×3): 2 mg via INTRAVENOUS
  Filled 2013-11-29 (×3): qty 1

## 2013-11-29 MED ORDER — SODIUM CHLORIDE 0.9 % IV BOLUS (SEPSIS)
1000.0000 mL | Freq: Once | INTRAVENOUS | Status: AC
  Administered 2013-11-29: 1000 mL via INTRAVENOUS

## 2013-11-29 MED ORDER — IOHEXOL 300 MG/ML  SOLN
50.0000 mL | Freq: Once | INTRAMUSCULAR | Status: AC | PRN
  Administered 2013-11-29: 50 mL via ORAL

## 2013-11-29 NOTE — ED Notes (Signed)
Report called to floor

## 2013-11-29 NOTE — ED Provider Notes (Signed)
Medical screening examination/treatment/procedure(s) were performed by non-physician practitioner and as supervising physician I was immediately available for consultation/collaboration.   Linwood Dibbles, MD 11/29/13 1057

## 2013-11-29 NOTE — ED Provider Notes (Signed)
CSN: [id number]     Arrival date & time 11/29/13  [id number] History   First MD Initiated Contact with Patient 11/29/13 0750     Chief Complaint  Patient presents with  . Abdominal Pain     (Consider location/radiation/quality/duration/timing/severity/associated sxs/prior Treatment) HPI  Patient with hx diverticulitis with suprapubic and LLQ pain that began 2 days ago.  Associated nausea, occasional chills and "feeling warm."  Has not taken temperature at home.  States his typical diverticulitis pain is only suprapubic and resolves with two days of clear liquid / light diet.  He has been taking only liquids and scrambled egg sandwiches since the pain began.  The suprapubic pain has resolved but the LLQ pain has gotten much worse.  Constant, nonradiating, worse with palpation, lying on right side, walking, coughing.  5/10 at rest, 8/10 with palpation or movement.  He has no hx abdominal surgeries.  Per wife, pt had colonoscopy 3 years ago that showed diverticulosis throughout colon.    Past Medical History  Diagnosis Date  . Diverticulitis     mult, latest 01/2012   Past Surgical History  Procedure Laterality Date  . Knee surgery x 2    . Sleep apnea surgery     Family History  Problem Relation Age of Onset  . Cancer Other    History  Substance Use Topics  . Smoking status: Never Smoker   . Smokeless tobacco: Not on file  . Alcohol Use: Yes     Comment: social    Review of Systems  Constitutional: Positive for chills and fatigue. Negative for fever, diaphoresis, appetite change and unexpected weight change.  Respiratory: Positive for cough (states normal cough with allergies). Negative for shortness of breath.   Cardiovascular: Negative for chest pain.  Gastrointestinal: Positive for nausea and abdominal pain. Negative for vomiting, diarrhea, constipation, blood in stool and anal bleeding.  Genitourinary: Negative for dysuria, urgency, frequency and hematuria.  All other systems  reviewed and are negative.     Allergies  Percocet  Home Medications   Prior to Admission medications   Medication Sig Start Date End Date Taking? Authorizing Provider  acetaminophen (TYLENOL) 500 MG tablet Take 500 mg by mouth every 6 (six) hours as needed.    Historical Provider, MD  ciprofloxacin (CIPRO) 500 MG tablet Take 500 mg by mouth 2 (two) times daily.    Historical Provider, MD  HYDROcodone-acetaminophen (NORCO/VICODIN) 5-325 MG per tablet Take 1 tablet by mouth every 4 (four) hours as needed for pain.    Historical Provider, MD  metroNIDAZOLE (FLAGYL) 500 MG tablet Take 500 mg by mouth 3 (three) times daily.    Historical Provider, MD   BP 143/92  Pulse 77  Temp(Src) 98 F (36.7 C) (Oral)  Resp 18  SpO2 100% Physical Exam  Nursing note and vitals reviewed. Constitutional: He appears well-developed and well-nourished. No distress.  HENT:  Head: Normocephalic and atraumatic.  Eyes: Conjunctivae are normal.  Neck: Neck supple.  Cardiovascular: Normal rate and regular rhythm.   Pulmonary/Chest: Effort normal and breath sounds normal. No respiratory distress. He has no wheezes. He has no rales.  Abdominal: Soft. Bowel sounds are normal. He exhibits no distension. There is tenderness in the left lower quadrant. There is no rebound, no guarding and no CVA tenderness.  Neurological: He is alert. He exhibits normal muscle tone.  Skin: He is not diaphoretic.  Psychiatric: He has a normal mood and affect. His behavior is normal. Thought content normal.  ED Course  Procedures (including critical care time) Labs Review Labs Reviewed  CBC WITH DIFFERENTIAL - Abnormal; Notable for the following:    WBC 11.2 (*)    HCT 38.8 (*)    Neutro Abs 7.8 (*)    Monocytes Absolute 1.1 (*)    All other components within normal limits  COMPREHENSIVE METABOLIC PANEL - Abnormal; Notable for the following:    Glucose, Bld 114 (*)    All other components within normal limits   URINALYSIS, ROUTINE W REFLEX MICROSCOPIC - Abnormal; Notable for the following:    APPearance CLOUDY (*)    All other components within normal limits    Imaging Review Ct Abdomen Pelvis W Contrast  11/29/2013   CLINICAL DATA:  LLQ ABDOMINAL PAIN hx diverticulitis  EXAM: CT ABDOMEN AND PELVIS WITH CONTRAST  TECHNIQUE: Multidetector CT imaging of the abdomen and pelvis was performed using the standard protocol following bolus administration of intravenous contrast.  CONTRAST:  100 mL Omnipaque 300 IV  COMPARISON:  01/21/2012  FINDINGS: Visualized lung bases clear. Unremarkable liver, gallbladder, spleen, adrenal glands, pancreas, right kidney. There is a 2 mm calculus in the lower pole left renal collecting system. No hydronephrosis. Portal vein patent. Minimal aortic plaque.  Stomach is physiologically distended by ingested contrast. Small bowel and colon are nondilated. Normal appendix. Multiple descending and sigmoid diverticula. Inflammatory/ edematous changes around the distal descending colon. No discrete abscess or free air. Urinary bladder physiologically distended. Mild prostatic enlargement with central coarse calcifications. Trace pelvic ascites. No adenopathy localized. Anterior spurs in the visualized lower thoracic spine and in the lower lumbar spine.   Electronically Signed   By: Oley Balm M.D.   On: 11/29/2013 10:22     EKG Interpretation None      MDM   Final diagnoses:  Diverticulitis    Pt with hx diverticulitis p/w LLQ pain, nausea.  CT showed diverticulitis without complication.  Pt admitted by [facility].   Trixie Dredge, PA-C 11/29/13 1050

## 2013-11-29 NOTE — ED Notes (Signed)
Patient transported to CT 

## 2013-11-29 NOTE — ED Notes (Signed)
Pt c/o left abd pain that started Saturday, denies n/v/d, last Bm this morning. Pt stats he has not eaten much in the pas two days.

## 2013-11-29 NOTE — H&P (Signed)
William Kirby is an 53 y.o. male.   Chief Complaint: abdominal pain HPI: The pt is a 53 yo wm who is known to our practice. He has had multiple episodes of simple diverticulitis in the past. He developed some suprapubic pain Saturday which resolved. Last night his pain recurred in the LLQ. No fever. No nausea or vomiting. He came to ER where CT shows sigmoid diverticulitis  Past Medical History  Diagnosis Date  . Diverticulitis     mult, latest 01/2012    Past Surgical History  Procedure Laterality Date  . Knee surgery x 2    . Sleep apnea surgery      Family History  Problem Relation Age of Onset  . Cancer Other    Social History:  reports that he has never smoked. He does not have any smokeless tobacco history on file. He reports that he drinks alcohol. He reports that he does not use illicit drugs.  Allergies:  Allergies  Allergen Reactions  . Percocet [Oxycodone-Acetaminophen] Rash     (Not in a hospital admission)  Results for orders placed during the hospital encounter of 11/29/13 (from the past 48 hour(s))  CBC WITH DIFFERENTIAL     Status: Abnormal   Collection Time    11/29/13  8:15 AM      Result Value Ref Range   WBC 11.2 (*) 4.0 - 10.5 K/uL   RBC 4.45  4.22 - 5.81 MIL/uL   Hemoglobin 13.4  13.0 - 17.0 g/dL   HCT 38.8 (*) 39.0 - 52.0 %   MCV 87.2  78.0 - 100.0 fL   MCH 30.1  26.0 - 34.0 pg   MCHC 34.5  30.0 - 36.0 g/dL   RDW 12.4  11.5 - 15.5 %   Platelets 205  150 - 400 K/uL   Neutrophils Relative % 69  43 - 77 %   Neutro Abs 7.8 (*) 1.7 - 7.7 K/uL   Lymphocytes Relative 19  12 - 46 %   Lymphs Abs 2.1  0.7 - 4.0 K/uL   Monocytes Relative 10  3 - 12 %   Monocytes Absolute 1.1 (*) 0.1 - 1.0 K/uL   Eosinophils Relative 2  0 - 5 %   Eosinophils Absolute 0.2  0.0 - 0.7 K/uL   Basophils Relative 0  0 - 1 %   Basophils Absolute 0.0  0.0 - 0.1 K/uL  COMPREHENSIVE METABOLIC PANEL     Status: Abnormal   Collection Time    11/29/13  8:15 AM      Result Value  Ref Range   Sodium 139  137 - 147 mEq/L   Potassium 3.9  3.7 - 5.3 mEq/L   Chloride 102  96 - 112 mEq/L   CO2 23  19 - 32 mEq/L   Glucose, Bld 114 (*) 70 - 99 mg/dL   BUN 12  6 - 23 mg/dL   Creatinine, Ser 0.95  0.50 - 1.35 mg/dL   Calcium 8.9  8.4 - 10.5 mg/dL   Total Protein 7.1  6.0 - 8.3 g/dL   Albumin 3.7  3.5 - 5.2 g/dL   AST 16  0 - 37 U/L   ALT 15  0 - 53 U/L   Alkaline Phosphatase 91  39 - 117 U/L   Total Bilirubin 0.9  0.3 - 1.2 mg/dL   GFR calc non Af Amer >90  >90 mL/min   GFR calc Af Amer >90  >90 mL/min   Comment: (NOTE)  The eGFR has been calculated using the CKD EPI equation.     This calculation has not been validated in all clinical situations.     eGFR's persistently <90 mL/min signify possible Chronic Kidney     Disease.  URINALYSIS, ROUTINE W REFLEX MICROSCOPIC     Status: Abnormal   Collection Time    11/29/13  8:41 AM      Result Value Ref Range   Color, Urine YELLOW  YELLOW   APPearance CLOUDY (*) CLEAR   Specific Gravity, Urine 1.014  1.005 - 1.030   pH 5.5  5.0 - 8.0   Glucose, UA NEGATIVE  NEGATIVE mg/dL   Hgb urine dipstick NEGATIVE  NEGATIVE   Bilirubin Urine NEGATIVE  NEGATIVE   Ketones, ur NEGATIVE  NEGATIVE mg/dL   Protein, ur NEGATIVE  NEGATIVE mg/dL   Urobilinogen, UA 0.2  0.0 - 1.0 mg/dL   Nitrite NEGATIVE  NEGATIVE   Leukocytes, UA NEGATIVE  NEGATIVE   Comment: MICROSCOPIC NOT DONE ON URINES WITH NEGATIVE PROTEIN, BLOOD, LEUKOCYTES, NITRITE, OR GLUCOSE <1000 mg/dL.   No results found.  Review of Systems  Constitutional: Negative.   HENT: Negative.   Eyes: Negative.   Respiratory: Negative.   Cardiovascular: Negative.   Gastrointestinal: Positive for abdominal pain. Negative for nausea and vomiting.  Genitourinary: Negative.   Musculoskeletal: Negative.   Skin: Negative.   Neurological: Negative.   Endo/Heme/Allergies: Negative.   Psychiatric/Behavioral: Negative.     Blood pressure 143/92, pulse 77, temperature 98 F  (36.7 C), temperature source Oral, resp. rate 18, SpO2 100.00%. Physical Exam  Constitutional: He is oriented to person, place, and time. He appears well-developed and well-nourished.  HENT:  Head: Normocephalic and atraumatic.  Eyes: Conjunctivae and EOM are normal. Pupils are equal, round, and reactive to light.  Neck: Normal range of motion. Neck supple.  Cardiovascular: Normal rate, regular rhythm and normal heart sounds.   Respiratory: Effort normal and breath sounds normal.  GI: Soft. Bowel sounds are normal.  Focal LLQ pain with some guarding. No peritonitis  Musculoskeletal: Normal range of motion.  Neurological: He is alert and oriented to person, place, and time.  Skin: Skin is warm and dry.  Psychiatric: He has a normal mood and affect. His behavior is normal.     Assessment/Plan Pt has sigmoid diverticulitis. Will plan to admit for bowel rest and IV abx.  Luella Cook III 11/29/2013, 10:00 AM

## 2013-11-30 DIAGNOSIS — Z79899 Other long term (current) drug therapy: Secondary | ICD-10-CM | POA: Diagnosis not present

## 2013-11-30 DIAGNOSIS — K5732 Diverticulitis of large intestine without perforation or abscess without bleeding: Secondary | ICD-10-CM | POA: Diagnosis present

## 2013-11-30 DIAGNOSIS — R1032 Left lower quadrant pain: Secondary | ICD-10-CM | POA: Diagnosis present

## 2013-11-30 DIAGNOSIS — Z791 Long term (current) use of non-steroidal anti-inflammatories (NSAID): Secondary | ICD-10-CM | POA: Diagnosis not present

## 2013-11-30 DIAGNOSIS — N200 Calculus of kidney: Secondary | ICD-10-CM | POA: Diagnosis present

## 2013-11-30 DIAGNOSIS — Z888 Allergy status to other drugs, medicaments and biological substances status: Secondary | ICD-10-CM | POA: Diagnosis not present

## 2013-11-30 LAB — CBC
HEMATOCRIT: 37.2 % — AB (ref 39.0–52.0)
Hemoglobin: 12.9 g/dL — ABNORMAL LOW (ref 13.0–17.0)
MCH: 30.9 pg (ref 26.0–34.0)
MCHC: 34.7 g/dL (ref 30.0–36.0)
MCV: 89.2 fL (ref 78.0–100.0)
Platelets: 172 10*3/uL (ref 150–400)
RBC: 4.17 MIL/uL — ABNORMAL LOW (ref 4.22–5.81)
RDW: 12.4 % (ref 11.5–15.5)
WBC: 8.8 10*3/uL (ref 4.0–10.5)

## 2013-11-30 LAB — BASIC METABOLIC PANEL
BUN: 9 mg/dL (ref 6–23)
CHLORIDE: 102 meq/L (ref 96–112)
CO2: 25 mEq/L (ref 19–32)
CREATININE: 1.03 mg/dL (ref 0.50–1.35)
Calcium: 8.7 mg/dL (ref 8.4–10.5)
GFR calc non Af Amer: 81 mL/min — ABNORMAL LOW (ref >90)
Glucose, Bld: 116 mg/dL — ABNORMAL HIGH (ref 70–99)
Potassium: 4 mEq/L (ref 3.7–5.3)
Sodium: 137 mEq/L (ref 137–147)

## 2013-11-30 MED ORDER — HYDROCODONE-ACETAMINOPHEN 5-325 MG PO TABS: 1.0000 | ORAL_TABLET | ORAL | Status: DC | PRN

## 2013-11-30 MED ORDER — ENOXAPARIN SODIUM 40 MG/0.4ML ~~LOC~~ SOLN
40.0000 mg | SUBCUTANEOUS | Status: DC
  Administered 2013-11-30 – 2013-12-01 (×2): 40 mg via SUBCUTANEOUS
  Filled 2013-11-30 (×3): qty 0.4

## 2013-11-30 NOTE — Progress Notes (Signed)
  Subjective: No complaints. Feels much better  Objective: Vital signs in last 24 hours: Temp:  [97.4 F (36.3 C)-99.4 F (37.4 C)] 97.4 F (36.3 C) (05/26 0941) Pulse Rate:  [61-74] 66 (05/26 0941) Resp:  [14-20] 14 (05/26 0941) BP: (122-139)/(67-82) 128/68 mmHg (05/26 0941) SpO2:  [95 %-100 %] 100 % (05/26 0941) Weight:  [187 lb (84.823 kg)] 187 lb (84.823 kg) (05/25 1416) Last BM Date: 11/29/13  Intake/Output from previous day: 05/25 0701 - 05/26 0700 In: 1975 [I.V.:1825; IV Piggyback:150] Out: 1725 [Urine:1725] Intake/Output this shift:    Resp: clear to auscultation bilaterally Cardio: regular rate and rhythm GI: soft, only mild LLQ tenderness. no guarding  Lab Results:   Recent Labs  11/29/13 0815 11/30/13 0424  WBC 11.2* 8.8  HGB 13.4 12.9*  HCT 38.8* 37.2*  PLT 205 172   BMET  Recent Labs  11/29/13 0815 11/30/13 0424  NA 139 137  K 3.9 4.0  CL 102 102  CO2 23 25  GLUCOSE 114* 116*  BUN 12 9  CREATININE 0.95 1.03  CALCIUM 8.9 8.7   PT/INR No results found for this basename: LABPROT, INR,  in the last 72 hours ABG No results found for this basename: PHART, PCO2, PO2, HCO3,  in the last 72 hours  Studies/Results: Ct Abdomen Pelvis W Contrast  11/29/2013   CLINICAL DATA:  LLQ ABDOMINAL PAIN hx diverticulitis  EXAM: CT ABDOMEN AND PELVIS WITH CONTRAST  TECHNIQUE: Multidetector CT imaging of the abdomen and pelvis was performed using the standard protocol following bolus administration of intravenous contrast.  CONTRAST:  100 mL Omnipaque 300 IV  COMPARISON:  01/21/2012  FINDINGS: Visualized lung bases clear. Unremarkable liver, gallbladder, spleen, adrenal glands, pancreas, right kidney. There is a 2 mm calculus in the lower pole left renal collecting system. No hydronephrosis. Portal vein patent. Minimal aortic plaque.  Stomach is physiologically distended by ingested contrast. Small bowel and colon are nondilated. Normal appendix. Multiple descending  and sigmoid diverticula. Inflammatory/ edematous changes around the distal descending colon. No discrete abscess or free air. Urinary bladder physiologically distended. Mild prostatic enlargement with central coarse calcifications. Trace pelvic ascites. No adenopathy localized. Anterior spurs in the visualized lower thoracic spine and in the lower lumbar spine.   Electronically Signed   By: Oley Balm M.D.   On: 11/29/2013 10:22    Anti-infectives: Anti-infectives   Start     Dose/Rate Route Frequency Ordered Stop   11/29/13 1400  metroNIDAZOLE (FLAGYL) IVPB 500 mg     500 mg 100 mL/hr over 60 Minutes Intravenous 3 times per day 11/29/13 1228     11/29/13 1300  ciprofloxacin (CIPRO) IVPB 400 mg     400 mg 200 mL/hr over 60 Minutes Intravenous Every 12 hours 11/29/13 1228        Assessment/Plan: s/p * No surgery found * Advance diet to fulls Continue IV cipro and flagyl Nutrition consult for education  LOS: 1 day    William Kirby 11/30/2013

## 2013-11-30 NOTE — Progress Notes (Signed)
Nutrition Education Note  RD consulted for nutrition education regarding diverticulitis. RD provided "Low Fiber Nutrition Therapy" handout from the [employer]. Discussed different food groups and their fiber content, emphasizing gradual addition of higher fiber-containing foods as symptoms resolve. Provided list of goods to eat and foods to avoid. Teach back method used.  Expect good compliance.  Body mass index is 24 kg/(m^2). Patient meets criteria for normal weight based on current BMI.   Current diet order is full liquid, patient is consuming approximately 100% of meals at this time. Labs and medications reviewed.   No nutrition interventions warranted at this time. If nutrition issues arise, please consult RD.   Levon Hedger MS, RD, LDN [phone] Pager [phone] After Hours Pager

## 2013-11-30 NOTE — Progress Notes (Signed)
Subjective: Pt doing well, pain almost nearly resolved.  No N/V.  BM this am and flatus.  Ambulating well.  Using IS.  Wife around on floor.    Objective: Vital signs in last 24 hours: Temp:  [97.6 F (36.4 C)-99.4 F (37.4 C)] 97.6 F (36.4 C) (05/26 0505) Pulse Rate:  [61-74] 65 (05/26 0505) Resp:  [18-20] 18 (05/26 0505) BP: (122-139)/(67-82) 125/70 mmHg (05/26 0505) SpO2:  [95 %-99 %] 98 % (05/26 0505) Weight:  [187 lb (84.823 kg)] 187 lb (84.823 kg) (05/25 1416) Last BM Date: 11/29/13  Intake/Output from previous day: 05/25 0701 - 05/26 0700 In: 1975 [I.V.:1825; IV Piggyback:150] Out: 1725 [Urine:1725] Intake/Output this shift:    PE: Gen:  Alert, NAD, pleasant Abd: Obese, soft, mild tenderness in the LLQ, ND, +BS, no HSM   Lab Results:   Recent Labs  11/29/13 0815 11/30/13 0424  WBC 11.2* 8.8  HGB 13.4 12.9*  HCT 38.8* 37.2*  PLT 205 172   BMET  Recent Labs  11/29/13 0815 11/30/13 0424  NA 139 137  K 3.9 4.0  CL 102 102  CO2 23 25  GLUCOSE 114* 116*  BUN 12 9  CREATININE 0.95 1.03  CALCIUM 8.9 8.7   PT/INR No results found for this basename: LABPROT, INR,  in the last 72 hours CMP     Component Value Date/Time   NA 137 11/30/2013 0424   K 4.0 11/30/2013 0424   CL 102 11/30/2013 0424   CO2 25 11/30/2013 0424   GLUCOSE 116* 11/30/2013 0424   BUN 9 11/30/2013 0424   CREATININE 1.03 11/30/2013 0424   CALCIUM 8.7 11/30/2013 0424   PROT 7.1 11/29/2013 0815   ALBUMIN 3.7 11/29/2013 0815   AST 16 11/29/2013 0815   ALT 15 11/29/2013 0815   ALKPHOS 91 11/29/2013 0815   BILITOT 0.9 11/29/2013 0815   GFRNONAA 81* 11/30/2013 0424   GFRAA >90 11/30/2013 0424   Lipase     Component Value Date/Time   LIPASE 32 01/21/2012 0647       Studies/Results: Ct Abdomen Pelvis W Contrast  11/29/2013   CLINICAL DATA:  LLQ ABDOMINAL PAIN hx diverticulitis  EXAM: CT ABDOMEN AND PELVIS WITH CONTRAST  TECHNIQUE: Multidetector CT imaging of the abdomen and pelvis was  performed using the standard protocol following bolus administration of intravenous contrast.  CONTRAST:  100 mL Omnipaque 300 IV  COMPARISON:  01/21/2012  FINDINGS: Visualized lung bases clear. Unremarkable liver, gallbladder, spleen, adrenal glands, pancreas, right kidney. There is a 2 mm calculus in the lower pole left renal collecting system. No hydronephrosis. Portal vein patent. Minimal aortic plaque.  Stomach is physiologically distended by ingested contrast. Small bowel and colon are nondilated. Normal appendix. Multiple descending and sigmoid diverticula. Inflammatory/ edematous changes around the distal descending colon. No discrete abscess or free air. Urinary bladder physiologically distended. Mild prostatic enlargement with central coarse calcifications. Trace pelvic ascites. No adenopathy localized. Anterior spurs in the visualized lower thoracic spine and in the lower lumbar spine.   Electronically Signed   By: Oley Balmaniel  Hassell M.D.   On: 11/29/2013 10:22    Anti-infectives: Anti-infectives   Start     Dose/Rate Route Frequency Ordered Stop   11/29/13 1400  metroNIDAZOLE (FLAGYL) IVPB 500 mg     500 mg 100 mL/hr over 60 Minutes Intravenous 3 times per day 11/29/13 1228     11/29/13 1300  ciprofloxacin (CIPRO) IVPB 400 mg     400 mg 200 mL/hr over  60 Minutes Intravenous Every 12 hours 11/29/13 1228         Assessment/Plan Sigmoid diverticulitis 3-4th episode (1 other hospitalization at [facility], 1 ER visit) Left nephrolithiasis - 32mm  Plan: 1.  Continue conservative management with IVF, pain control, antibiotics, antiemetics 2.  Ambulate and IS 3.  SCD's and lovenox 4.  Hopefully will resolve with conservative mgt alone, but he will need to think about an elective surgical resection at some point given the multiple re-occurances.  He will need to heal for 6-8 weeks and he will need a colonoscopy. 5.  Discussed diet, asked dietitian to come see him. 6.  Discussed with Alvino Chapel his wife  as well.    LOS: 1 day    [facility] 11/30/2013, 8:07 AM Pager: [phone]

## 2013-11-30 NOTE — Care Management Note (Signed)
    Page 1 of 1   11/30/2013     11:44:52 AM CARE MANAGEMENT NOTE 11/30/2013  Patient:  HANNAH, [place]   Account Number:  [id number]  Date Initiated:  11/30/2013  Documentation initiated by:  Lorenda Ishihara  Subjective/Objective Assessment:   53 yo male admitted with diverticulitis. PTA lived at home with spouse.     Action/Plan:   Home when stable   Anticipated DC Date:  11/30/2013   Anticipated DC Plan:  HOME/SELF CARE      DC Planning Services  CM consult      Choice offered to / List presented to:             Status of service:  Completed, signed off Medicare Important Message given?   (If response is "NO", the following Medicare IM given date fields will be blank) Date Medicare IM given:   Date Additional Medicare IM given:    Discharge Disposition:  HOME/SELF CARE  Per UR Regulation:  Reviewed for med. necessity/level of care/duration of stay  If discussed at Long Length of Stay Meetings, dates discussed:    Comments:

## 2013-11-30 NOTE — Progress Notes (Signed)
UR completed. Patient changed to inpatient- requiring IV antibiotics and IVF 

## 2013-12-01 MED ORDER — CIPROFLOXACIN HCL 500 MG PO TABS: 500.0000 mg | ORAL_TABLET | Freq: Two times a day (BID) | ORAL | Status: DC

## 2013-12-01 MED ORDER — HYDROCODONE-ACETAMINOPHEN 5-325 MG PO TABS: 1.0000 | ORAL_TABLET | Freq: Four times a day (QID) | ORAL | Status: DC | PRN

## 2013-12-01 MED ORDER — METRONIDAZOLE 500 MG PO TABS: 500.0000 mg | ORAL_TABLET | Freq: Three times a day (TID) | ORAL | Status: DC

## 2013-12-01 NOTE — Discharge Instructions (Signed)
Diverticulitis A diverticulum is a small pouch or sac on the colon. Diverticulosis is the presence of these diverticula on the colon. Diverticulitis is the irritation (inflammation) or infection of diverticula. CAUSES  The colon and its diverticula contain bacteria. If food particles block the tiny opening to a diverticulum, the bacteria inside can grow and cause an increase in pressure. This leads to infection and inflammation and is called diverticulitis. SYMPTOMS   Abdominal pain and tenderness. Usually, the pain is located on the left side of your abdomen. However, it could be located elsewhere.  Fever.  Bloating.  Feeling sick to your stomach (nausea).  Throwing up (vomiting).  Abnormal stools. DIAGNOSIS  Your caregiver will take a history and perform a physical exam. Since many things can cause abdominal pain, other tests may be necessary. Tests may include:  Blood tests.  Urine tests.  X-ray of the abdomen.  CT scan of the abdomen. Sometimes, surgery is needed to determine if diverticulitis or other conditions are causing your symptoms. TREATMENT  Most of the time, you can be treated without surgery. Treatment includes:  Resting the bowels by only having liquids for a few days. As you improve, you will need to eat a low-fiber diet.  Intravenous (IV) fluids if you are losing body fluids (dehydrated).  Antibiotic medicines that treat infections may be given.  Pain and nausea medicine, if needed.  Surgery if the inflamed diverticulum has burst. HOME CARE INSTRUCTIONS   Try a clear liquid diet (broth, tea, or water for as long as directed by your caregiver). You may then gradually begin a low-fiber diet as tolerated.  A low-fiber diet is a diet with less than 10 grams of fiber. Choose the foods below to reduce fiber in the diet:  White breads, cereals, rice, and pasta.  Cooked fruits and vegetables or soft fresh fruits and vegetables without the skin.  Ground or  well-cooked tender beef, ham, veal, lamb, pork, or poultry.  Eggs and seafood.  After your diverticulitis symptoms have improved, your caregiver may put you on a high-fiber diet. A high-fiber diet includes 14 grams of fiber for every 1000 calories consumed. For a standard 2000 calorie diet, you would need 28 grams of fiber. Follow these diet guidelines to help you increase the fiber in your diet. It is important to slowly increase the amount fiber in your diet to avoid gas, constipation, and bloating.  Choose whole-grain breads, cereals, pasta, and brown rice.  Choose fresh fruits and vegetables with the skin on. Do not overcook vegetables because the more vegetables are cooked, the more fiber is lost.  Choose more nuts, seeds, legumes, dried peas, beans, and lentils.  Look for food products that have greater than 3 grams of fiber per serving on the Nutrition Facts label.  Take all medicine as directed by your caregiver.  If your caregiver has given you a follow-up appointment, it is very important that you go. Not going could result in lasting (chronic) or permanent injury, pain, and disability. If there is any problem keeping the appointment, call to reschedule. SEEK MEDICAL CARE IF:   Your pain does not improve.  You have a hard time advancing your diet beyond clear liquids.  Your bowel movements do not return to normal. SEEK IMMEDIATE MEDICAL CARE IF:   Your pain becomes worse.  You have an oral temperature above 102 F (38.9 C), not controlled by medicine.  You have repeated vomiting.  You have bloody or black, tarry stools.    Symptoms that brought you to your caregiver become worse or are not getting better. MAKE SURE YOU:   Understand these instructions.  Will watch your condition.  Will get help right away if you are not doing well or get worse. Document Released: 04/03/2005 Document Revised: 09/16/2011 Document Reviewed: 07/30/2010 ExitCare Patient Information  2014 [employer].   Low-Fiber Diet Fiber is found in fruits, vegetables, and grains. A low-fiber diet restricts fibrous foods that are not digested in the small intestine. A diet containing about 10 grams of fiber is considered low fiber.  PURPOSE  To prevent blockage of a partially obstructed or narrowed gastrointestinal tract.  To reduce fecal weight and volume.  To slow the movement of feces. WHEN IS THIS DIET USED?  It may be used during the acute phase of Crohn disease, ulcerative colitis, regional enteritis, or diverticulitis.  It may be used if your intestinal or esophageal tubes are narrowing (stenosis).  It may be used as a transitional diet following surgery, injury (trauma), or illness. CHOOSING FOODS Check labels, especially on foods from the starch list. Often times, dietary fiber content is listed on the nutrition facts panel. Please ask your Registered Dietitian if you have questions about specific foods that are related to your condition, especially if the food is not listed on this handout. Breads and Starches  Allowed: White, French, and pita breads, plain rolls, buns, or sweet rolls, doughnuts, waffles, pancakes, bagels. Plain muffins, biscuits, matzoth. Soda, saltine, graham crackers. Pretzels, rusks, melba toast, zwieback. Cooked cereals: cornmeal, farina, or cream cereals. Dry cereals: refined corn, wheat, rice, and oat cereals (check label). Potatoes prepared any way without skins, refined macaroni, spaghetti, noodles, refined rice.  Avoid: Whole-wheat bread, rolls, and crackers. Multigrains, rye, bran seeds, nuts, or coconut. Cereals containing whole grains, multigrains, bran, coconut, nuts, raisins. Cooked or dry oatmeal. Coarse wheat cereals, granola. Cereals advertised as "high fiber." Potato skins. Whole-grain pasta, wild or brown rice. Popcorn. Vegetables  Allowed: Strained tomato and vegetable juices. Fresh lettuce, cucumber, spinach. Well-cooked or  canned: asparagus, bean sprouts, broccoli, cut green beans, cauliflower, pumpkin, beets, mushrooms, yellow squash, tomato, tomato sauce, zucchini, turnips.Keep servings limited to  cup.  Avoid: Fresh, cooked, or canned: artichokes, baked beans, beet greens, Brussels sprouts, corn, kale, legumes, peas, sweet potatoes. Avoid large servings of any vegetables. Fruit  Allowed: All fruit juices except prune juice. Cooked or canned fruits without skin and seeds: apricots, applesauce, cantaloupe, cherries, grapefruit, grapes, kiwi, mandarin oranges, peaches, pears, fruit cocktail, pineapple, plums, watermelon. Fresh without skin: banana, grapes, cantaloupe, avocado, cherries, pineapple, kiwi, nectarines, peaches, blueberries. Keep servings limited to  cup or 1 piece.  Avoid: Fresh: apples with or without skin, apricots, mangoes, pears, raspberries, strawberries. Prune juice and juices with pulp, stewed or dried prunes. Dried fruits, raisins, dates. Avoid large servings of all fresh fruits. Meat and Protein Substitutes  Allowed: Ground or well-cooked tender beef, ham, veal, lamb, pork, poultry. Eggs, plain cheese. Fish, oysters, shrimp, lobster, other seafood. Liver, organ meats. Smooth nut butters.  Avoid: Tough, fibrous meats with gristle. Chunky nut butter.Cheese with seeds, nuts, or other foods not allowed. Nuts, seeds, legumes, dried peas, beans, lentils. Dairy  Allowed: All milk products except those not allowed.  Avoid: Yogurt or cheese that contains nuts, seeds, or added fruit. Soups and Combination Foods  Allowed: Bouillon, broth, or cream soups made from allowed foods. Any strained soup. Casseroles or mixed dishes made with allowed foods.  Avoid: Soups made from vegetables that are   not allowed or that contain other foods not allowed. Desserts and Sweets  Allowed:Plain cakes and cookies, pie made with allowed fruit, pudding, custard, cream pie. Gelatin, fruit, ice, sherbet, frozen ice  pops. Ice cream, ice milk without nuts. Plain hard candy, honey, jelly, molasses, syrup, sugar, chocolate syrup, gumdrops, marshmallows.  Avoid: Desserts, cookies, or candies that contain nuts, peanut butter, dried fruits. Jams, preserves with seeds, marmalade. Fats and Oils  Allowed:Margarine, butter, cream, mayonnaise, salad oils, plain salad dressings made from allowed foods.  Avoid: Seeds, nuts, olives. Beverages  Allowed: All, except those listed to avoid.  Avoid: Fruit juices with high pulp, prune juice. Condiments  Allowed:Ketchup, mustard, horseradish, vinegar, cream sauce, cheese sauce, cocoa powder. Spices in moderation: allspice, basil, bay leaves, celery powder or leaves, cinnamon, cumin powder, curry powder, ginger, mace, marjoram, onion or garlic powder, oregano, paprika, parsley flakes, ground pepper, rosemary, sage, savory, tarragon, thyme, turmeric.  Avoid: Coconut, pickles. SAMPLE MENU Breakfast   cup orange juice.  1 boiled egg.  1 slice white toast.  Margarine.   cup cornflakes.  1 cup milk.  Beverage. Lunch   cup chicken noodle soup.  2 to 3 oz sliced roast beef.  2 slices white bread.  Mayonnaise.   cup tomato juice.  1 small banana.  Beverage. Dinner  3 oz baked chicken.   cup scalloped potatoes.   cup cooked beets.  White dinner roll.  Margarine.   cup canned peaches.  Beverage. Document Released: 12/14/2001 Document Revised: 02/24/2013 Document Reviewed: 07/11/2011 ExitCare Patient Information 2014 [employer].  

## 2013-12-01 NOTE — Discharge Summary (Signed)
Physician Discharge Summary  Patient ID: William Kirby MRN: [id number] DOB/AGE: 1960-10-10 53 y.o.  Admit date: 11/29/2013 Discharge date: 12/01/2013  Admitting Diagnosis: Sigmoid/descending colon diverticulitis  Discharge Diagnosis Patient Active Problem List   Diagnosis Date Noted  . Diverticulitis 11/29/2013  . OBESITY 02/23/2010  . DIVERTICULAR DISEASE 02/23/2010  . PAIN IN JOINT, SHOULDER REGION 02/23/2010  . THUMB PAIN, LEFT 02/23/2010    Consultants None  Imaging: Ct Abdomen Pelvis W Contrast  11/29/2013   CLINICAL DATA:  LLQ ABDOMINAL PAIN hx diverticulitis  EXAM: CT ABDOMEN AND PELVIS WITH CONTRAST  TECHNIQUE: Multidetector CT imaging of the abdomen and pelvis was performed using the standard protocol following bolus administration of intravenous contrast.  CONTRAST:  100 mL Omnipaque 300 IV  COMPARISON:  01/21/2012  FINDINGS: Visualized lung bases clear. Unremarkable liver, gallbladder, spleen, adrenal glands, pancreas, right kidney. There is a 2 mm calculus in the lower pole left renal collecting system. No hydronephrosis. Portal vein patent. Minimal aortic plaque.  Stomach is physiologically distended by ingested contrast. Small bowel and colon are nondilated. Normal appendix. Multiple descending and sigmoid diverticula. Inflammatory/ edematous changes around the distal descending colon. No discrete abscess or free air. Urinary bladder physiologically distended. Mild prostatic enlargement with central coarse calcifications. Trace pelvic ascites. No adenopathy localized. Anterior spurs in the visualized lower thoracic spine and in the lower lumbar spine.   Electronically Signed   By: Oley Balm M.D.   On: 11/29/2013 10:22    Procedures None  Hospital Course:  53 yo wm who is known to our practice.  He has had multiple episodes of simple diverticulitis in the past. He developed some suprapubic pain Saturday (11/27/13) which resolved. On 11/28/13 the pain recurred in the  LLQ. No fever. No nausea or vomiting. He came to [facility] where CT shows sigmoid diverticulitis.  Patient was admitted and was transferred to the floor for conservative management of  His diverticulitis.  Luckily his pain resolved within 24 hours and thus his diet was advanced as tolerated.  On HD #3, the patient was voiding well, tolerating diet, ambulating well, pain well controlled, vital signs stable, and felt stable for discharge home.  Patient will follow up in our office in 2-3 weeks and knows to call with questions or concerns.  He will need a colonoscopy in 6-8 weeks as he is overdue anyway.  Physical Exam: General:  Alert, NAD, pleasant, comfortable Abd:  Obese, soft, ND, minimal tenderness in LLQ, no HSM    Medication List         ciprofloxacin 500 MG tablet  Commonly known as:  CIPRO  Take 1 tablet (500 mg total) by mouth 2 (two) times daily.     HYDROcodone-acetaminophen 5-325 MG per tablet  Commonly known as:  NORCO/VICODIN  Take 1-2 tablets by mouth every 6 (six) hours as needed for moderate pain or severe pain.     ibuprofen 200 MG tablet  Commonly known as:  ADVIL,MOTRIN  Take 400 mg by mouth every 6 (six) hours as needed for mild pain.     loratadine 10 MG tablet  Commonly known as:  CLARITIN  Take 10 mg by mouth daily.     metroNIDAZOLE 500 MG tablet  Commonly known as:  FLAGYL  Take 1 tablet (500 mg total) by mouth 3 (three) times daily.         Follow-up Information   Follow up with Valarie Merino, MD. Schedule an appointment as soon as possible for a visit  in 3 weeks. (For post-hospital follow up)    Specialty:  General Surgery   Contact information:   [address] [place] [address] [phone]       Signed: Candiss NorseMegan Dort, PA-C [facility] [phone]  12/01/2013, 9:00 AM

## 2013-12-16 ENCOUNTER — Encounter (INDEPENDENT_AMBULATORY_CARE_PROVIDER_SITE_OTHER): Payer: Self-pay | Admitting: Surgery

## 2013-12-16 ENCOUNTER — Ambulatory Visit (INDEPENDENT_AMBULATORY_CARE_PROVIDER_SITE_OTHER): Payer: No Typology Code available for payment source | Admitting: Surgery

## 2013-12-16 VITALS — BP 122/80 | HR 71 | Temp 98.3°F | Ht 75.0 in | Wt 271.0 lb

## 2013-12-16 DIAGNOSIS — Z8719 Personal history of other diseases of the digestive system: Secondary | ICD-10-CM

## 2013-12-16 NOTE — Progress Notes (Signed)
Chief Complaint:  Stabilized with left colon diverticulitis in May 2015  History of Present Illness:  William Kirby is an 53 y.o. male who had her first bout with diverticulitis in November of 2008 requiring hospitalization. Since then he's had some recurrent bouts most recently in May. I reviewed his CT scan again and this appears to be descending diverticulitis. Currently he is asymptomatic. I think that mikes problems include is commuting over the [place] which disrupt his bowel habits, the reliance on fast food, his weight and trying to squeeze into size 38 pants with a failed GERD CM and increases his abdominal pressure.  I've recommended that he complete his Cipro and Flagyl. I would likely need to wear coveralls, overalls, or large pannus with suspenders to avoid girding his abdominal cavity. He can resume a high fiber diet in July and I suggested that he get some probiotics. I will see  him in early August to consider a barium enema at that time to quantitate his diverticular disease.   Past Medical History  Diagnosis Date  . Diverticulitis     mult, latest 01/2012    Past Surgical History  Procedure Laterality Date  . Knee surgery x 2    . Sleep apnea surgery      Current Outpatient Prescriptions  Medication Sig Dispense Refill  . ciprofloxacin (CIPRO) 500 MG tablet Take 1 tablet (500 mg total) by mouth 2 (two) times daily.  28 tablet  0  . ibuprofen (ADVIL,MOTRIN) 200 MG tablet Take 400 mg by mouth every 6 (six) hours as needed for mild pain.      Marland Kitchen loratadine (CLARITIN) 10 MG tablet Take 10 mg by mouth daily.      . metroNIDAZOLE (FLAGYL) 500 MG tablet Take 1 tablet (500 mg total) by mouth 3 (three) times daily.  42 tablet  0   No current facility-administered medications for this visit.   Percocet Family History  Problem Relation Age of Onset  . Cancer Other    Social History:   reports that he has never smoked. He does not have any smokeless tobacco history on file. He  reports that he drinks alcohol. He reports that he does not use illicit drugs.   REVIEW OF SYSTEMS : Positive for abdominal pain; weight gain ; otherwise negative  Physical Exam:   Blood pressure 122/80, pulse 71, temperature 98.3 F (36.8 C), height 6\' 3"  (1.905 m), weight 271 lb (122.925 kg). Body mass index is 33.87 kg/(m^2).  Gen:  WDWN WM NAD  Neurological: Alert and oriented to person, place, and time. Motor and sensory function is grossly intact  Head: Normocephalic and atraumatic.  Eyes: Conjunctivae are normal. Pupils are equal, round, and reactive to light. No scleral icterus.  Neck: Normal range of motion. Neck supple. No tracheal deviation or thyromegaly present.  Cardiovascular:  SR without murmurs or gallops.  No carotid bruits Breast:  Not examined Respiratory: Effort normal.  No respiratory distress. No chest wall tenderness. Breath sounds normal.  No wheezes, rales or rhonchi.  Abdomen:  Wearing a belt which is restricting his abdomen GU:  Not examined Musculoskeletal: Normal range of motion. Extremities are nontender. No cyanosis, edema or clubbing noted Lymphadenopathy: No cervical, preauricular, postauricular or axillary adenopathy is present Skin: Skin is warm and dry. No rash noted. No diaphoresis. No erythema. No pallor. Pscyh: Normal mood and affect. Behavior is normal. Judgment and thought content normal.   LABORATORY RESULTS: No results found for this or any previous  visit (from the past 48 hour(s)).   RADIOLOGY RESULTS: No results found.  Problem List: Patient Active Problem List   Diagnosis Date Noted  . Diverticulitis 11/29/2013  . OBESITY 02/23/2010  . DIVERTICULAR DISEASE 02/23/2010  . PAIN IN JOINT, SHOULDER REGION 02/23/2010  . THUMB PAIN, LEFT 02/23/2010    Assessment & Plan: Resolving diverticulitis-try lifestyle modification to avoid recurrent episodes.  Will get BE in August to assess extent of disease.     Matt B. Daphine DeutscherMartin, MD,  [facility]  [facility] [phone] beeper [phone]  12/16/2013 10:53 AM

## 2013-12-16 NOTE — Patient Instructions (Signed)
Watch the tight belt and restrictive garments-consider overalls or loose pants with suspenders Complete the course of Cipro and Flagyl.   Would stay on low residue diet until the end of June and then advance to high fiber diet. Will get a barium enema in early August.  Weight loss would be helpful Add probiotics daily - can get over the counter at pharmacy

## 2014-02-03 ENCOUNTER — Encounter (INDEPENDENT_AMBULATORY_CARE_PROVIDER_SITE_OTHER): Payer: Self-pay | Admitting: Surgery

## 2014-02-03 ENCOUNTER — Ambulatory Visit (INDEPENDENT_AMBULATORY_CARE_PROVIDER_SITE_OTHER): Payer: No Typology Code available for payment source | Admitting: Surgery

## 2014-02-03 VITALS — BP 124/82 | HR 77 | Temp 98.0°F | Ht 75.0 in | Wt 280.0 lb

## 2014-02-03 DIAGNOSIS — Z8719 Personal history of other diseases of the digestive system: Secondary | ICD-10-CM

## 2014-02-03 NOTE — Progress Notes (Signed)
William Kirby 53 y.o.  Body mass index is 35 kg/(m^2).  Patient Active Problem List   Diagnosis Date Noted  . Diverticulitis 11/29/2013  . OBESITY 02/23/2010  . DIVERTICULAR DISEASE 02/23/2010  . PAIN IN JOINT, SHOULDER REGION 02/23/2010  . THUMB PAIN, LEFT 02/23/2010    Allergies  Allergen Reactions  . Percocet [Oxycodone-Acetaminophen] Rash      Past Surgical History  Procedure Laterality Date  . Knee surgery x 2    . Sleep apnea surgery     William BoydenJavier Gutierrez, MD No diagnosis found.  Doing well with diet.  His job is very stressful and [place] much [place]  I think that he should continue with William Kirby preparing his lunches and maintaining his regularity.  Will not get a BE at this time.  Will follow along as needed.  William B. Daphine DeutscherMartin, MD, [facility]  [facility] [phone] beeper [phone]  02/03/2014 9:39 AM

## 2016-05-24 DIAGNOSIS — Z0000 Encounter for general adult medical examination without abnormal findings: Secondary | ICD-10-CM | POA: Diagnosis not present

## 2016-10-03 DIAGNOSIS — M1990 Unspecified osteoarthritis, unspecified site: Secondary | ICD-10-CM | POA: Diagnosis not present

## 2016-10-03 DIAGNOSIS — J069 Acute upper respiratory infection, unspecified: Secondary | ICD-10-CM | POA: Diagnosis not present

## 2016-10-28 ENCOUNTER — Ambulatory Visit (INDEPENDENT_AMBULATORY_CARE_PROVIDER_SITE_OTHER): Payer: BLUE CROSS/BLUE SHIELD | Admitting: Physician Assistant

## 2016-10-28 ENCOUNTER — Ambulatory Visit (INDEPENDENT_AMBULATORY_CARE_PROVIDER_SITE_OTHER): Payer: BLUE CROSS/BLUE SHIELD

## 2016-10-28 DIAGNOSIS — M25561 Pain in right knee: Secondary | ICD-10-CM | POA: Diagnosis not present

## 2016-10-28 MED ORDER — LIDOCAINE HCL 1 % IJ SOLN
3.0000 mL | INTRAMUSCULAR | Status: AC | PRN
  Administered 2016-10-28: 3 mL

## 2016-10-28 MED ORDER — METHYLPREDNISOLONE ACETATE 40 MG/ML IJ SUSP
40.0000 mg | INTRAMUSCULAR | Status: AC | PRN
  Administered 2016-10-28: 40 mg via INTRA_ARTICULAR

## 2016-10-28 NOTE — Progress Notes (Signed)
Office Visit Note   Patient: William Kirby           Date of Birth: [birth year]           MRN: [id number] Visit Date: 10/28/2016              Requested by: Eustaquio Boyden, MD [address] [place], [place] [address] PCP: No PCP Per Patient   Assessment & Plan: Visit Diagnoses:  1. Right knee pain, unspecified chronicity     Plan: Quad strengthening exercises reviewed with patient. Follow-up in 2 weeks to check his progress lack of. Also checked for any mechanical symptoms or recurrent effusion  Follow-Up Instructions: Return in about 2 weeks (around 11/11/2016).   Orders:  Orders Placed This Encounter  Procedures  . Large Joint Injection/Arthrocentesis  . XR KNEE 3 VIEW RIGHT   No orders of the defined types were placed in this encounter.     Procedures: Large Joint Inj Date/Time: 10/28/2016 11:33 AM Performed by: Kirtland Bouchard Authorized by: Kirtland Bouchard   Consent Given by:  Patient Indications:  Pain Location:  Knee Site:  R knee Needle Size:  22 G Approach:  Anterolateral Ultrasound Guidance: No   Fluoroscopic Guidance: No   Medications:  40 mg methylPREDNISolone acetate 40 MG/ML; 3 mL lidocaine 1 % Aspiration Attempted: Yes   Aspirate amount (mL):  20 Aspirate:  Yellow Patient tolerance:  Patient tolerated the procedure well with no immediate complications     Clinical Data: No additional findings.   Subjective: Chief Complaint  Patient presents with  . Right Knee - Injury  . Knee Pain    patient injured knee 10/18/16 when trying to help wife get out of truck-twisted knee and felt pop    HPI Mr. Herter is a 56 year old male comes in today for right knee pain. He has a history of a right knee in softball injury in the 90s underwent a knee reconstruction that included an anterior cruciate ligament and possible lateral collateral ligament and medial collateral ligament per patient. No notes are available for the surgery. He was doing  relatively well until he reinjured the knee on 10/18/2016. He is trying to help his wife get out of the truck and felt a pop in his knee knee overall is improving. He has noticed swelling Denies any mechanical symptoms. He is taking ibuprofen for pain. Review of Systems Negative outsided history of present illness please see history of present illness  Objective: Vital Signs: There were no vitals taken for this visit.  Physical Exam  Constitutional: He is oriented to person, place, and time. He appears well-developed and well-nourished. No distress.  Pulmonary/Chest: Effort normal.  Neurological: He is alert and oriented to person, place, and time.  Psychiatric: He has a normal mood and affect. His behavior is normal.    Ortho Exam Bilateral knees no abnormal warmth erythema. Right knee with effusion. Well-healed surgical incision lateral aspect of the right knee. Bilateral knees without any instability valgus varus stressing. Right knee anterior drawer is negative. Good range of motion of both knees. Negative McMurray's right knee Specialty Comments:  No specialty comments available.  Imaging: Xr Knee 3 View Right  Result Date: 10/28/2016 3 views right knee: No acute fracture. Retained hardware previous surgeries seen. Mild-to-moderate medial lateral joint line changes. Moderate patellofemoral changes.    PMFS History: Patient Active Problem List   Diagnosis Date Noted  . Diverticulitis 11/29/2013  . OBESITY 02/23/2010  . DIVERTICULAR  DISEASE 02/23/2010  . PAIN IN JOINT, SHOULDER REGION 02/23/2010  . THUMB PAIN, LEFT 02/23/2010   Past Medical History:  Diagnosis Date  . Diverticulitis    mult, latest 01/2012    Family History  Problem Relation Age of Onset  . Cancer Other     Past Surgical History:  Procedure Laterality Date  . knee surgery x 2    . sleep apnea surgery     Social History   Occupational History  . Not on file.   Social History Main Topics  .  Smoking status: Never Smoker  . Smokeless tobacco: Not on file  . Alcohol use Yes     Comment: social  . Drug use: No  . Sexual activity: Not on file

## 2016-11-11 ENCOUNTER — Encounter (INDEPENDENT_AMBULATORY_CARE_PROVIDER_SITE_OTHER): Payer: Self-pay | Admitting: Physician Assistant

## 2016-11-11 ENCOUNTER — Ambulatory Visit (INDEPENDENT_AMBULATORY_CARE_PROVIDER_SITE_OTHER): Payer: BLUE CROSS/BLUE SHIELD | Admitting: Physician Assistant

## 2016-11-11 DIAGNOSIS — M25461 Effusion, right knee: Secondary | ICD-10-CM | POA: Diagnosis not present

## 2016-11-11 NOTE — Progress Notes (Signed)
Mr. Wynona NeatMassie returns today follow-up of his right knee status post cortisone injection for 10/28/16. He continues to have pain in his right knee. He states cortisone injection did help initially but this worn off. He is now having swelling of the knee. He's having no true mechanical symptoms but just feels as if he cannot trust the knee. He start taking tonight. Occasional over-the-counter NSAID. He does have a history of her right knee softball injury in the 90s and underwent a knee reconstruction that included an anterior cruciate ligament and possible lateral collateral ligament and medial collateral ligament repair. He had done well until he was helping his wife get out of his truck on 10/18/2016 and felt a pop in his knee since then he's had pain and swelling knee.  Physical exam: Right knee recurrent effusion. No instability with valgus varus stressing. Anterior drawer negative. Negative McMurray's. Minimal tenderness along medial lateral joint lines..   Plan: Due to patient's continued pain and recurrent effusion in his right knee despite conservative treatment consisting of time cortisone injection and home exercises would recommend MRI. MRI history rule out internal derangement knee. He'll follow with us after the MRI to go over results and discuss further treatment.

## 2017-01-07 ENCOUNTER — Ambulatory Visit (INDEPENDENT_AMBULATORY_CARE_PROVIDER_SITE_OTHER): Payer: BLUE CROSS/BLUE SHIELD | Admitting: Orthopaedic Surgery

## 2017-01-07 ENCOUNTER — Other Ambulatory Visit (INDEPENDENT_AMBULATORY_CARE_PROVIDER_SITE_OTHER): Payer: Self-pay

## 2017-01-07 DIAGNOSIS — G8929 Other chronic pain: Secondary | ICD-10-CM

## 2017-01-07 DIAGNOSIS — M25461 Effusion, right knee: Secondary | ICD-10-CM

## 2017-01-07 DIAGNOSIS — M25561 Pain in right knee: Secondary | ICD-10-CM

## 2017-01-07 DIAGNOSIS — M25512 Pain in left shoulder: Secondary | ICD-10-CM | POA: Diagnosis not present

## 2017-01-07 MED ORDER — LIDOCAINE HCL 1 % IJ SOLN
3.0000 mL | INTRAMUSCULAR | Status: AC | PRN
  Administered 2017-01-07: 3 mL

## 2017-01-07 MED ORDER — METHYLPREDNISOLONE ACETATE 40 MG/ML IJ SUSP
40.0000 mg | INTRAMUSCULAR | Status: AC | PRN
  Administered 2017-01-07: 40 mg via INTRA_ARTICULAR

## 2017-01-07 NOTE — Progress Notes (Signed)
Office Visit Note   Patient: William Kirby           Date of Birth: [birth year]           MRN: [id number] Visit Date: 01/07/2017              Requested by: No referring provider defined for this encounter. PCP: Patient, No Pcp Per   Assessment & Plan: Visit Diagnoses:  1. Chronic pain of right knee   2. Effusion, right knee   3. Chronic left shoulder pain     Plan: Due to the multiple effusions of his right knee to consider or trauma he's had an MRI is medically necessary at this point to assess the cartilage of his knee determine what the best treatment course would be at this point. He is tried and failed all forms of conservative treatment. This is been going on for years. He is working on [employer] worker[employer] training exercises and other modalities to calm down his pain but there is continued recurrent effusions. MRI will be necessary to assess once going on within the knee is weak and best determine further options. As far as his left shoulder goes, I talked to him about trying a steroid injection subacromial space and is agreeable to this. I spent risk and benefits injections to him as well. He tolerated this injection well. We'll see him back in 2 weeks no flip had MRI of his right knee at that point to go over.  Follow-Up Instructions: Return in about 2 weeks (around 01/21/2017).   Orders:  Orders Placed This Encounter  Procedures  . Large Joint Injection/Arthrocentesis   No orders of the defined types were placed in this encounter.     Procedures: Large Joint Inj Date/Time: 01/07/2017 10:11 AM Performed by: Kathryne HitchBLACKMAN, Drevon Plog Y Authorized by: Kathryne HitchBLACKMAN, Kalisa Girtman Y   Location:  Shoulder Site:  L subacromial bursa Ultrasound Guidance: No   Fluoroscopic Guidance: No   Arthrogram: No   Medications:  3 mL lidocaine 1 %; 40 mg methylPREDNISolone acetate 40 MG/ML     Clinical Data: No additional findings.   Subjective: No chief complaint on file. The patient is been seen  recently for his knee on the right side. He said recurrent effusions of this knee and we'll order MRI but somehow thinks look through the cracks within do not get ordered. He's had a history of multiple traumas and multiple surgeries to the right knee. He gets recurrent effusions as well. He has an effusion again today. The knee does locking catching gives way on him. He is also been dealing with left shoulder pain. He's had 2 different traumas left shoulder from playing sports. 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 He wonders if we can at least inject his left shoulder today. He still waiting ordered about the MRI of his right knee.  HPI  Review of Systems He currently denies any headache, chest pain, shortness of breath, fever, chills, nausea, vomiting.  Objective: Vital Signs: There were no vitals taken for this visit.  Physical Exam He is alert or 3 in no acute distress Ortho Exam Examination of the right knee shows recurrent effusion. There is well-healed surgical incisions all around his knee. The knee feels stable but is very painful about his range of motion. I was able to aspirate 30 mL of serous fluid from the knee. For his left shoulder goes  he does have positive signs of impingement of the shoulder limited motion due to pain. There is some slight weakness in the left shoulder as well. Specialty Comments:  No specialty comments available.  Imaging: No results found.   PMFS History: Patient Active Problem List   Diagnosis Date Noted  . Chronic pain of right knee 01/07/2017  . Effusion, right knee 01/07/2017  . Chronic left shoulder pain 01/07/2017  . Diverticulitis 11/29/2013  . OBESITY 02/23/2010  . DIVERTICULAR DISEASE 02/23/2010  . PAIN IN JOINT, SHOULDER REGION 02/23/2010  . THUMB PAIN, LEFT 02/23/2010   Past Medical History:  Diagnosis Date  . Diverticulitis    mult, latest 01/2012      Family History  Problem Relation Age of Onset  . Cancer Other     Past Surgical History:  Procedure Laterality Date  . knee surgery x 2    . sleep apnea surgery     Social History   Occupational History  . Not on file.   Social History Main Topics  . Smoking status: Never Smoker  . Smokeless tobacco: Never Used  . Alcohol use Yes     Comment: social  . Drug use: No  . Sexual activity: Not on file

## 2017-01-21 ENCOUNTER — Ambulatory Visit (INDEPENDENT_AMBULATORY_CARE_PROVIDER_SITE_OTHER): Payer: BLUE CROSS/BLUE SHIELD | Admitting: Orthopaedic Surgery

## 2017-01-25 ENCOUNTER — Ambulatory Visit
Admission: RE | Admit: 2017-01-25 | Discharge: 2017-01-25 | Disposition: A | Discharge status: Home / Self Care | Payer: No Typology Code available for payment source | Source: Ambulatory Visit | Attending: Orthopaedic Surgery | Admitting: Orthopaedic Surgery

## 2017-01-25 DIAGNOSIS — M23221 Derangement of posterior horn of medial meniscus due to old tear or injury, right knee: Secondary | ICD-10-CM | POA: Diagnosis not present

## 2017-01-25 DIAGNOSIS — G8929 Other chronic pain: Secondary | ICD-10-CM

## 2017-01-25 DIAGNOSIS — M25561 Pain in right knee: Principal | ICD-10-CM

## 2017-01-27 ENCOUNTER — Ambulatory Visit (INDEPENDENT_AMBULATORY_CARE_PROVIDER_SITE_OTHER): Payer: BLUE CROSS/BLUE SHIELD | Admitting: Orthopaedic Surgery

## 2017-01-27 DIAGNOSIS — G8929 Other chronic pain: Secondary | ICD-10-CM

## 2017-01-27 DIAGNOSIS — M23200 Derangement of unspecified lateral meniscus due to old tear or injury, right knee: Secondary | ICD-10-CM | POA: Diagnosis not present

## 2017-01-27 DIAGNOSIS — M25561 Pain in right knee: Secondary | ICD-10-CM | POA: Diagnosis not present

## 2017-01-27 DIAGNOSIS — M25511 Pain in right shoulder: Secondary | ICD-10-CM | POA: Diagnosis not present

## 2017-01-27 DIAGNOSIS — M23203 Derangement of unspecified medial meniscus due to old tear or injury, right knee: Secondary | ICD-10-CM

## 2017-01-27 MED ORDER — LIDOCAINE HCL 1 % IJ SOLN
3.0000 mL | INTRAMUSCULAR | Status: AC | PRN
  Administered 2017-01-27: 3 mL

## 2017-01-27 MED ORDER — METHYLPREDNISOLONE ACETATE 40 MG/ML IJ SUSP
40.0000 mg | INTRAMUSCULAR | Status: AC | PRN
  Administered 2017-01-27: 40 mg via INTRA_ARTICULAR

## 2017-01-27 NOTE — Progress Notes (Signed)
Office Visit Note   Patient: William Kirby           Date of Birth: 07/28/1960           MRN: [id number] Visit Date: 01/27/2017              Requested by: No referring provider defined for this encounter. PCP: Patient, No Pcp Per   Assessment & Plan: Visit Diagnoses:  1. Chronic pain of right knee   2. Acute pain of right shoulder   3. Old complex tear of medial meniscus of right knee   4. Old complex tear of lateral meniscus of right knee     Plan: Per his wishes we did place a steroid injection in his right shoulder subacromial space. We will also set him up for a right knee arthroscopy. I think this will be knees or intervention for him and he understands this may just temporize things and given the tricompartmental arthritic changes he may need a total knee arthroplasty in the future however given his work constraints he would like to at least try an arthroscopic intervention and I agree with this. With thorough a long discussion about the risk and benefits of the surgery and we went over his MRI in detail. All questions were encouraged and answered. We'll see him back in 1 week for suture removal after the arthroscopy.  Follow-Up Instructions: Return for 1 week post-op.   Orders:  Orders Placed This Encounter  Procedures  . Large Joint Injection/Arthrocentesis   No orders of the defined types were placed in this encounter.     Procedures: Large Joint Inj Date/Time: 01/27/2017 9:37 AM Performed by: Kathryne HitchBLACKMAN, CHRISTOPHER Y Authorized by: Kathryne HitchBLACKMAN, CHRISTOPHER Y   Location:  Shoulder Site:  R subacromial bursa Ultrasound Guidance: No   Fluoroscopic Guidance: No   Arthrogram: No   Medications:  3 mL lidocaine 1 %; 40 mg methylPREDNISolone acetate 40 MG/ML     Clinical Data: No additional findings.   Subjective: No chief complaint on file. The patient is here for follow-up today after MRI of his right knee. This is a knee that had a anterior cruciate ligament  reconstruction and MCL reconstruction back in the 90s. He's been having recurrent effusions of that knee with had aspirated several times. He is currently been having some right shoulder problems. We injected his left shoulder recently and that helped greatly. His right shoulder hurting with overhead activities and reaching in front of him. He Manufacturing engineermanages large construction projects. He said the fusion is recurred on his right knee today and he like to have a steroid injection in his right shoulder as well as aspiration of his right knee.  HPI  Review of Systems He currently denies any headache, chest pain, shortness of breath, fever, chills, nausea, vomiting.  Objective: Vital Signs: There were no vitals taken for this visit.  Physical Exam He is alert and oriented 3 and in no acute distress Ortho Exam Examination of his right knee shows a reaccumulation of a large effusion. I was able to aspirate 40-50 mL of fluid off of his knee and there is some sediment. This gave immediate relief. His right shoulder has positive Neer and Hawkins sign but good range of motion and good strength but definitely painful. Specialty Comments:  No specialty comments available.  Imaging: No results found. The MRI of his right knee shows significant tricompartmental arthritic changes throughout the knee. There is a moderate effusion and significant osteochondral fragments in  the knee. There is significant tearing of the medial and lateral meniscus.  PMFS History: Patient Active Problem List   Diagnosis Date Noted  . Chronic pain of right knee 01/07/2017  . Effusion, right knee 01/07/2017  . Chronic left shoulder pain 01/07/2017  . Diverticulitis 11/29/2013  . OBESITY 02/23/2010  . DIVERTICULAR DISEASE 02/23/2010  . PAIN IN JOINT, SHOULDER REGION 02/23/2010  . THUMB PAIN, LEFT 02/23/2010   Past Medical History:  Diagnosis Date  . Diverticulitis    mult, latest 01/2012    Family History  Problem Relation  Age of Onset  . Cancer Other     Past Surgical History:  Procedure Laterality Date  . knee surgery x 2    . sleep apnea surgery     Social History   Occupational History  . Not on file.   Social History Main Topics  . Smoking status: Never Smoker  . Smokeless tobacco: Never Used  . Alcohol use Yes     Comment: social  . Drug use: No  . Sexual activity: Not on file

## 2017-01-29 ENCOUNTER — Ambulatory Visit (INDEPENDENT_AMBULATORY_CARE_PROVIDER_SITE_OTHER): Payer: BLUE CROSS/BLUE SHIELD | Admitting: Orthopaedic Surgery

## 2017-02-05 ENCOUNTER — Ambulatory Visit (INDEPENDENT_AMBULATORY_CARE_PROVIDER_SITE_OTHER): Payer: BLUE CROSS/BLUE SHIELD | Admitting: Orthopaedic Surgery

## 2017-02-06 ENCOUNTER — Encounter: Payer: Self-pay | Admitting: Orthopaedic Surgery

## 2017-02-06 DIAGNOSIS — M94261 Chondromalacia, right knee: Secondary | ICD-10-CM | POA: Diagnosis not present

## 2017-02-06 DIAGNOSIS — M23221 Derangement of posterior horn of medial meniscus due to old tear or injury, right knee: Secondary | ICD-10-CM | POA: Diagnosis not present

## 2017-02-06 DIAGNOSIS — M23203 Derangement of unspecified medial meniscus due to old tear or injury, right knee: Secondary | ICD-10-CM | POA: Diagnosis not present

## 2017-02-06 DIAGNOSIS — M23200 Derangement of unspecified lateral meniscus due to old tear or injury, right knee: Secondary | ICD-10-CM | POA: Diagnosis not present

## 2017-02-06 DIAGNOSIS — G8918 Other acute postprocedural pain: Secondary | ICD-10-CM | POA: Diagnosis not present

## 2017-02-06 DIAGNOSIS — M23251 Derangement of posterior horn of lateral meniscus due to old tear or injury, right knee: Secondary | ICD-10-CM | POA: Diagnosis not present

## 2017-02-13 ENCOUNTER — Ambulatory Visit (INDEPENDENT_AMBULATORY_CARE_PROVIDER_SITE_OTHER): Payer: BLUE CROSS/BLUE SHIELD | Admitting: Physician Assistant

## 2017-02-13 DIAGNOSIS — Z9889 Other specified postprocedural states: Secondary | ICD-10-CM

## 2017-02-13 NOTE — Progress Notes (Signed)
William Kirby returns today status post right knee arthroscopy. He feels better than preop. Having no mechanical symptoms and this point time. He is having a Pain shortness breath fevers chills.  PE: Right knee good range of motion without pain. Port sites well possibly with nylon sutures. Calf supple nontender. Slight laxity with anterior drawer  Impression: One week status post right knee arthroscopy  Plan:  Quad strengthening. Sutures are removed today. SCAR tissue mobilization. Discussed with him possible cortisone injection for supplemental injection in the future due to the arthritic changes in his knee. He'll continue over-the-counter NSAIDs as needed.

## 2017-03-05 DIAGNOSIS — Z23 Encounter for immunization: Secondary | ICD-10-CM | POA: Diagnosis not present

## 2017-03-07 DIAGNOSIS — R5383 Other fatigue: Secondary | ICD-10-CM | POA: Diagnosis not present

## 2017-03-07 DIAGNOSIS — R0602 Shortness of breath: Secondary | ICD-10-CM | POA: Diagnosis not present

## 2017-03-07 DIAGNOSIS — M791 Myalgia: Secondary | ICD-10-CM | POA: Diagnosis not present

## 2017-03-13 DIAGNOSIS — R5382 Chronic fatigue, unspecified: Secondary | ICD-10-CM | POA: Diagnosis not present

## 2017-03-17 ENCOUNTER — Ambulatory Visit (INDEPENDENT_AMBULATORY_CARE_PROVIDER_SITE_OTHER): Payer: BLUE CROSS/BLUE SHIELD | Admitting: Physician Assistant

## 2017-03-17 ENCOUNTER — Ambulatory Visit (INDEPENDENT_AMBULATORY_CARE_PROVIDER_SITE_OTHER): Payer: BLUE CROSS/BLUE SHIELD

## 2017-03-17 DIAGNOSIS — M25511 Pain in right shoulder: Secondary | ICD-10-CM

## 2017-03-17 DIAGNOSIS — G8929 Other chronic pain: Secondary | ICD-10-CM

## 2017-03-17 NOTE — Progress Notes (Signed)
Office Visit Note   Patient: William Kirby           Date of Birth: 1960/11/25           MRN: [id number] Visit Date: 03/17/2017              Requested by: No referring provider defined for this encounter. PCP: Patient, No Pcp Per   Assessment & Plan: Visit Diagnoses:  1. Chronic right shoulder pain     Plan: Due to patient's continued pain in his right shoulder despite conservative treatment which consisted of cortisone injection subacromially, home exercises and time would recommend MRI. He'll follow up after the MRI of the right shoulder rule out rotator cuff tear. Questions encouraged and answered.  Follow-Up Instructions: Return for FOLLOW UP AFTER MRI.   Orders:  Orders Placed This Encounter  Procedures  . XR Shoulder Right  . MR SHOULDER RIGHT WO CONTRAST   No orders of the defined types were placed in this encounter.     Procedures: No procedures performed   Clinical Data: No additional findings.   Subjective: Chief Complaint  Patient presents with  . Right Knee - Routine Post Op  . Right Shoulder - Pain    HPI William Kirby returns today follow-up of his right knee status post knee arthroscopy 02/06/2017. States that his knee is overall doing great no problems. His main complaint today though is his right shoulder which is now having chronic pain in for some time. He states that his shoulder pain overall is becoming worse. He fell on August 20 while trying to get away from this loss slid in the modified onto his right shoulder. Now he is having difficulty sleeping due to the right shoulder pain. Has occasional numbness tingling in his third through fifth fingers bilaterally but no radicular symptoms down the arms are otherwise. 7 no neck pain. Been seen by his primary care physician recently due to overall generalized achy sensation and blood work was done EKG was done he is thought to have chronic fatigue syndrome. Cement placed on Cymbalta [place] and feels that  these overall helping with his shoulder pain continues.  Review of Systems Please see history of present illness otherwise negative  Objective: Vital Signs: There were no vitals taken for this visit.  Physical Exam  Constitutional: He is oriented to person, place, and time. He appears well-developed and well-nourished. No distress.  Neurological: He is alert and oriented to person, place, and time.  Skin: He is not diaphoretic.  Psychiatric: He has a normal mood and affect.    Ortho Exam Right knee has full extension and full flexion. Port sites well-healed no signs of infection. Calf supple nontender. Bilateral shoulders he has weakness with external rotation of the right shoulder against resistance. Positive impingement on the right negative empty can test bilaterally. Flaccid last few degrees of forward flexion of the right shoulder. Specialty Comments:  No specialty comments available.  Imaging: Xr Shoulder Right  Result Date: 03/17/2017 Right shoulder AP and Y view: No acute fractures. Shoulders well located. Glenohumeral joint appears to be well maintained. Acromioclavicular joint well maintained.    PMFS History: Patient Active Problem List   Diagnosis Date Noted  . S/P right knee arthroscopy 02/13/2017  . Chronic pain of right knee 01/07/2017  . Effusion, right knee 01/07/2017  . Chronic left shoulder pain 01/07/2017  . Diverticulitis 11/29/2013  . OBESITY 02/23/2010  . DIVERTICULAR DISEASE 02/23/2010  . PAIN IN JOINT, SHOULDER  REGION 02/23/2010  . THUMB PAIN, LEFT 02/23/2010   Past Medical History:  Diagnosis Date  . Diverticulitis    mult, latest 01/2012    Family History  Problem Relation Age of Onset  . Cancer Other     Past Surgical History:  Procedure Laterality Date  . knee surgery x 2    . sleep apnea surgery     Social History   Occupational History  . Not on file.   Social History Main Topics  . Smoking status: Never Smoker  . Smokeless  tobacco: Never Used  . Alcohol use Yes     Comment: social  . Drug use: No  . Sexual activity: Not on file

## 2017-03-27 ENCOUNTER — Ambulatory Visit
Admission: RE | Admit: 2017-03-27 | Discharge: 2017-03-27 | Disposition: A | Discharge status: Home / Self Care | Payer: No Typology Code available for payment source | Source: Ambulatory Visit | Attending: Physician Assistant | Admitting: Physician Assistant

## 2017-03-27 DIAGNOSIS — G8929 Other chronic pain: Secondary | ICD-10-CM

## 2017-03-27 DIAGNOSIS — M25511 Pain in right shoulder: Secondary | ICD-10-CM | POA: Diagnosis not present

## 2017-03-31 ENCOUNTER — Encounter (INDEPENDENT_AMBULATORY_CARE_PROVIDER_SITE_OTHER): Payer: Self-pay | Admitting: Physician Assistant

## 2017-03-31 ENCOUNTER — Ambulatory Visit (INDEPENDENT_AMBULATORY_CARE_PROVIDER_SITE_OTHER): Payer: BLUE CROSS/BLUE SHIELD | Admitting: Physician Assistant

## 2017-03-31 DIAGNOSIS — M75121 Complete rotator cuff tear or rupture of right shoulder, not specified as traumatic: Secondary | ICD-10-CM | POA: Diagnosis not present

## 2017-03-31 NOTE — Progress Notes (Signed)
William Kirby returns today follow-up of his right shoulder. He is status post MRI. Continues to have severe pain in the shoulder particularly days after he is been working outside. He is having difficulty sleeping due to the pain.  MRI right shoulder dated 03/27/2017: Supraspinatus tendon with severe tendinosis and full-thickness tear measuring 17 mm anterior to posterior with 12 mm of retraction. Severe tendinosis of the subscapularis tendon with partial-thickness tear. The humeral joint no chondral defects. Type I acromion. Mild arthropathy of the acromioclavicular joint.  Impression :Right shoulder tendinosis with rotator cuff tear  Plan: Due to William Kirby severe pain in his shoulder and the fact that it affects his quality of life recommend right shoulder arthroscopy with extensive debridement and possible rotator cuff repair. Discussed with patient the recovery if were able to repair his cuff and the recovery if the cuff is unrepairable. He understands that due to the size of the cuff tear and the retraction that this may be nonrepairable. We'll see him back 1 week postop. Questions encouraged and answered length

## 2017-04-24 DIAGNOSIS — M659 Synovitis and tenosynovitis, unspecified: Secondary | ICD-10-CM | POA: Diagnosis not present

## 2017-04-24 DIAGNOSIS — G8918 Other acute postprocedural pain: Secondary | ICD-10-CM | POA: Diagnosis not present

## 2017-04-24 DIAGNOSIS — M7551 Bursitis of right shoulder: Secondary | ICD-10-CM | POA: Diagnosis not present

## 2017-04-24 DIAGNOSIS — M7531 Calcific tendinitis of right shoulder: Secondary | ICD-10-CM | POA: Diagnosis not present

## 2017-04-24 DIAGNOSIS — M7521 Bicipital tendinitis, right shoulder: Secondary | ICD-10-CM | POA: Diagnosis not present

## 2017-04-24 DIAGNOSIS — S46011A Strain of muscle(s) and tendon(s) of the rotator cuff of right shoulder, initial encounter: Secondary | ICD-10-CM | POA: Diagnosis not present

## 2017-04-24 DIAGNOSIS — M75121 Complete rotator cuff tear or rupture of right shoulder, not specified as traumatic: Secondary | ICD-10-CM | POA: Diagnosis not present

## 2017-05-01 ENCOUNTER — Inpatient Hospital Stay (INDEPENDENT_AMBULATORY_CARE_PROVIDER_SITE_OTHER): Payer: BLUE CROSS/BLUE SHIELD | Admitting: Orthopaedic Surgery

## 2017-05-02 ENCOUNTER — Ambulatory Visit (INDEPENDENT_AMBULATORY_CARE_PROVIDER_SITE_OTHER): Payer: BLUE CROSS/BLUE SHIELD | Admitting: Orthopaedic Surgery

## 2017-05-02 ENCOUNTER — Encounter (INDEPENDENT_AMBULATORY_CARE_PROVIDER_SITE_OTHER): Payer: Self-pay | Admitting: Orthopaedic Surgery

## 2017-05-02 DIAGNOSIS — Z9889 Other specified postprocedural states: Secondary | ICD-10-CM

## 2017-05-02 NOTE — Progress Notes (Signed)
The patient is now 1 week status post a right shoulder arthroscopy and a rotator cuff repair.  He said he is doing great.  His sling is in his truck when he walked in today but he said he is been wearing it.  On exam the suture line looks great and remove the sutures.  His axillary nerve function is intact.  Distally his motor and sensory functions intact of the right shoulder.  He can abduct his right shoulder 90 degrees and can externally rotated almost fully.  I went over the arthroscopy pictures with him and showed him exercises and wanted to try.  He understands not to lift anything heavy with that arm and not to perform any overhead activities for 6 weeks.  He would rather not go to physical therapy and since he is doing so well we will hold off on that as well.  I like to see him back in 4 weeks he is doing overall.  All questions and concerns were answered is dressed.

## 2017-05-27 DIAGNOSIS — R5382 Chronic fatigue, unspecified: Secondary | ICD-10-CM | POA: Diagnosis not present

## 2017-05-27 DIAGNOSIS — Z0001 Encounter for general adult medical examination with abnormal findings: Secondary | ICD-10-CM | POA: Diagnosis not present

## 2017-05-27 DIAGNOSIS — E669 Obesity, unspecified: Secondary | ICD-10-CM | POA: Diagnosis not present

## 2017-05-27 DIAGNOSIS — Z23 Encounter for immunization: Secondary | ICD-10-CM | POA: Diagnosis not present

## 2017-06-02 ENCOUNTER — Encounter (INDEPENDENT_AMBULATORY_CARE_PROVIDER_SITE_OTHER): Payer: Self-pay | Admitting: Orthopaedic Surgery

## 2017-06-02 ENCOUNTER — Ambulatory Visit (INDEPENDENT_AMBULATORY_CARE_PROVIDER_SITE_OTHER): Payer: BLUE CROSS/BLUE SHIELD | Admitting: Orthopaedic Surgery

## 2017-06-02 DIAGNOSIS — Z9889 Other specified postprocedural states: Secondary | ICD-10-CM

## 2017-06-02 NOTE — Progress Notes (Signed)
The patient is 6 weeks now and to a rotator cuff repair of his right shoulder.  He says his shoulder is doing great and other than just a little bit of pain and weakness he has no issues with it at all.  He has not been through any therapy either per his request and choice given his good function.  On exam he can fully abduct the shoulder without using his deltoids on the right side he can fully externally rotate as well.  He shows just some slight weakness overall.  At this point no follow-up as needed since he is doing so well but he understands it traditionally takes about 5-6 months to get over the surgery and given his age certainly there is a failure rate as well that is higher than the younger population.  He just needs to be as careful as possible.  He says sometimes he may need to come back for me to see his left shoulder.  Already operated on his right knee as well and so denies doing well.  All questions and concerns were answered and addressed.  Follow-up as needed.

## 2018-06-25 ENCOUNTER — Encounter (INDEPENDENT_AMBULATORY_CARE_PROVIDER_SITE_OTHER): Payer: Self-pay | Admitting: Orthopaedic Surgery

## 2018-06-25 ENCOUNTER — Ambulatory Visit (INDEPENDENT_AMBULATORY_CARE_PROVIDER_SITE_OTHER): Payer: Self-pay

## 2018-06-25 ENCOUNTER — Ambulatory Visit (INDEPENDENT_AMBULATORY_CARE_PROVIDER_SITE_OTHER): Payer: BLUE CROSS/BLUE SHIELD | Admitting: Orthopaedic Surgery

## 2018-06-25 ENCOUNTER — Other Ambulatory Visit (INDEPENDENT_AMBULATORY_CARE_PROVIDER_SITE_OTHER): Payer: Self-pay

## 2018-06-25 DIAGNOSIS — M25512 Pain in left shoulder: Secondary | ICD-10-CM

## 2018-06-25 DIAGNOSIS — M25511 Pain in right shoulder: Secondary | ICD-10-CM | POA: Diagnosis not present

## 2018-06-25 MED ORDER — TRAMADOL HCL 50 MG PO TABS: 50.0000 mg | ORAL_TABLET | Freq: Four times a day (QID) | ORAL | 0 refills | Status: AC | PRN

## 2018-06-25 NOTE — Progress Notes (Signed)
Office Visit Note   Patient: William Kirby           Date of Birth: 05/05/1961           MRN: [id number] Visit Date: 06/25/2018              Requested by: No referring provider defined for this encounter. PCP: Patient, No Pcp Per   Assessment & Plan: Visit Diagnoses:  1. Acute pain of left shoulder   2. Acute pain of right shoulder     Plan: Based on his plain film findings I do feel that he has an acute on chronic rotator cuff issue.  The shoulder is high riding and shows some evidence of rotator cuff arthropathy.  Even though we had a good repair and he did well for period of time certainly a rotator cuff can have some failure after surgery and now has had a hard mechanical fall.  I did place a steroid in the subacromial space of both shoulders to help with pain over the holidays and will send in some tramadol.  We will order MRIs of both shoulders to assess the rotator cuff and other pathology within both shoulders.  [place] going [place] closer to [place] and closer for him and postoperative follow-up and therapy.  All question concerns were answered and addressed.  I was able to find his old arthroscopy pictures of his shoulder and his knee and give them to him.  Follow-Up Instructions: Return if symptoms worsen or fail to improve.   Orders:  Orders Placed This Encounter  Procedures  . XR Shoulder Left  . XR Shoulder Right   Meds ordered this encounter  Medications  . traMADol (ULTRAM) 50 MG tablet    Sig: Take 1-2 tablets (50-100 mg total) by mouth every 6 (six) hours as needed.    Dispense:  40 tablet    Refill:  0      Procedures: No procedures performed   Clinical Data: No additional findings.   Subjective: Chief Complaint  Patient presents with  . Right Shoulder - Pain, Injury  . Left Shoulder - Pain, Injury  The patient is very well-known to me.  He has a history of a right shoulder  arthroscopy with a rotator cuff repair in 2018.  Postoperatively he done very well with that shoulder and did not require a lot of therapy.  He had no issues with that either.  A few weeks ago he had a significant mechanical fall landing on both of his shoulders.  He is a significant pain and weakness in both shoulder since then.  He was at a bowling alley when he slipped and fell.  He denies any neck pain denies any numbness and tingling.  He said a few headaches but he says some of this he thinks are related more to the pain he is dealing with with the shoulders.  Of note he now [place] in [place].  He does not have an established physician of there yet so he came down here for us to take a look at his shoulders having seen this before and without having performed right shoulder surgery on him.  HPI  Review of Systems He currently denies any headache, chest pain, shortness of breath, fever, chills, nausea, vomiting.  Objective: Vital Signs: There were no vitals taken for this visit.  Physical Exam He is alert and oriented x3  and in no acute distress Ortho Exam Examination of both shoulder shows limited range of motion with abduction quite difficult past 90 degrees.  It is easier on his left shoulder than the right shoulder.  The right shoulder has significant limitations with external rotation and significant weakness with external rotation.  The left shoulder feels a little stronger.  He is using more of his deltoids on the right than the left also abduct his shoulder. Specialty Comments:  No specialty comments available.  Imaging: Xr Shoulder Left  Result Date: 06/25/2018 3 views of left shoulder show no acute findings.  There is acromioclavicular arthritic changes.  The humeral head is in a central position within the glenoid.  Xr Shoulder Right  Result Date: 06/25/2018 3 views of the right shoulder show no acute findings.  The humeral head is high riding and its  relationship to the glenoid suggesting a chronic rotator cuff issue.  The patient does have a previous history of rotator cuff surgery.    PMFS History: Patient Active Problem List   Diagnosis Date Noted  . Status post right rotator cuff repair 05/02/2017  . S/P right knee arthroscopy 02/13/2017  . Chronic pain of right knee 01/07/2017  . Effusion, right knee 01/07/2017  . Chronic left shoulder pain 01/07/2017  . Diverticulitis 11/29/2013  . OBESITY 02/23/2010  . DIVERTICULAR DISEASE 02/23/2010  . PAIN IN JOINT, SHOULDER REGION 02/23/2010  . THUMB PAIN, LEFT 02/23/2010   Past Medical History:  Diagnosis Date  . Diverticulitis    mult, latest 01/2012    Family History  Problem Relation Age of Onset  . Cancer Other     Past Surgical History:  Procedure Laterality Date  . knee surgery x 2    . sleep apnea surgery     Social History   Occupational History  . Not on file  Tobacco Use  . Smoking status: Never Smoker  . Smokeless tobacco: Never Used  Substance and Sexual Activity  . Alcohol use: Yes    Comment: social  . Drug use: No  . Sexual activity: Not on file

## 2018-07-06 ENCOUNTER — Encounter (INDEPENDENT_AMBULATORY_CARE_PROVIDER_SITE_OTHER): Payer: Self-pay | Admitting: *Deleted

## 2018-07-20 ENCOUNTER — Telehealth (INDEPENDENT_AMBULATORY_CARE_PROVIDER_SITE_OTHER): Payer: Self-pay | Admitting: Orthopaedic Surgery

## 2018-07-20 NOTE — Telephone Encounter (Signed)
Returned call to patient left message stating he need to complete a medical records release form so that records can be faxed to [facility]    Fax# [phone]    Pt # is [phone]

## 2018-07-23 DIAGNOSIS — M65811 Other synovitis and tenosynovitis, right shoulder: Secondary | ICD-10-CM | POA: Diagnosis not present

## 2018-07-23 DIAGNOSIS — M75122 Complete rotator cuff tear or rupture of left shoulder, not specified as traumatic: Secondary | ICD-10-CM | POA: Diagnosis not present

## 2018-07-23 DIAGNOSIS — M659 Synovitis and tenosynovitis, unspecified: Secondary | ICD-10-CM | POA: Diagnosis not present

## 2018-07-23 DIAGNOSIS — M19012 Primary osteoarthritis, left shoulder: Secondary | ICD-10-CM | POA: Diagnosis not present

## 2018-07-23 DIAGNOSIS — M19011 Primary osteoarthritis, right shoulder: Secondary | ICD-10-CM | POA: Diagnosis not present

## 2018-08-12 DIAGNOSIS — M25512 Pain in left shoulder: Secondary | ICD-10-CM | POA: Diagnosis not present

## 2018-08-12 DIAGNOSIS — F40240 Claustrophobia: Secondary | ICD-10-CM | POA: Diagnosis not present

## 2018-08-12 DIAGNOSIS — M25511 Pain in right shoulder: Secondary | ICD-10-CM | POA: Diagnosis not present

## 2018-08-19 DIAGNOSIS — M25511 Pain in right shoulder: Secondary | ICD-10-CM | POA: Diagnosis not present

## 2018-09-04 DIAGNOSIS — M25512 Pain in left shoulder: Secondary | ICD-10-CM | POA: Diagnosis not present

## 2018-09-04 DIAGNOSIS — M25511 Pain in right shoulder: Secondary | ICD-10-CM | POA: Diagnosis not present

## 2018-10-26 DIAGNOSIS — M75122 Complete rotator cuff tear or rupture of left shoulder, not specified as traumatic: Secondary | ICD-10-CM | POA: Diagnosis not present

## 2018-11-03 DIAGNOSIS — M24112 Other articular cartilage disorders, left shoulder: Secondary | ICD-10-CM | POA: Diagnosis not present

## 2018-11-03 DIAGNOSIS — M66312 Spontaneous rupture of flexor tendons, left shoulder: Secondary | ICD-10-CM | POA: Diagnosis not present

## 2018-11-03 DIAGNOSIS — G8918 Other acute postprocedural pain: Secondary | ICD-10-CM | POA: Diagnosis not present

## 2018-11-03 DIAGNOSIS — S43432D Superior glenoid labrum lesion of left shoulder, subsequent encounter: Secondary | ICD-10-CM | POA: Diagnosis not present

## 2018-11-03 DIAGNOSIS — M7542 Impingement syndrome of left shoulder: Secondary | ICD-10-CM | POA: Diagnosis not present

## 2018-11-03 DIAGNOSIS — M7522 Bicipital tendinitis, left shoulder: Secondary | ICD-10-CM | POA: Diagnosis not present

## 2018-11-03 DIAGNOSIS — M94212 Chondromalacia, left shoulder: Secondary | ICD-10-CM | POA: Diagnosis not present

## 2018-11-03 DIAGNOSIS — M7552 Bursitis of left shoulder: Secondary | ICD-10-CM | POA: Diagnosis not present

## 2018-11-03 DIAGNOSIS — M659 Synovitis and tenosynovitis, unspecified: Secondary | ICD-10-CM | POA: Diagnosis not present

## 2018-11-03 DIAGNOSIS — M25712 Osteophyte, left shoulder: Secondary | ICD-10-CM | POA: Diagnosis not present

## 2018-11-03 DIAGNOSIS — M75122 Complete rotator cuff tear or rupture of left shoulder, not specified as traumatic: Secondary | ICD-10-CM | POA: Diagnosis not present

## 2018-11-03 DIAGNOSIS — S46192D Other injury of muscle, fascia and tendon of long head of biceps, left arm, subsequent encounter: Secondary | ICD-10-CM | POA: Diagnosis not present

## 2018-11-03 DIAGNOSIS — M25512 Pain in left shoulder: Secondary | ICD-10-CM | POA: Diagnosis not present

## 2018-11-03 DIAGNOSIS — S46112A Strain of muscle, fascia and tendon of long head of biceps, left arm, initial encounter: Secondary | ICD-10-CM | POA: Diagnosis not present

## 2018-11-16 DIAGNOSIS — M75122 Complete rotator cuff tear or rupture of left shoulder, not specified as traumatic: Secondary | ICD-10-CM | POA: Diagnosis not present

## 2018-11-23 DIAGNOSIS — M25512 Pain in left shoulder: Secondary | ICD-10-CM | POA: Diagnosis not present

## 2018-11-23 DIAGNOSIS — M6281 Muscle weakness (generalized): Secondary | ICD-10-CM | POA: Diagnosis not present

## 2018-11-23 DIAGNOSIS — M75122 Complete rotator cuff tear or rupture of left shoulder, not specified as traumatic: Secondary | ICD-10-CM | POA: Diagnosis not present

## 2018-11-23 DIAGNOSIS — M25612 Stiffness of left shoulder, not elsewhere classified: Secondary | ICD-10-CM | POA: Diagnosis not present

## 2018-12-02 DIAGNOSIS — M25612 Stiffness of left shoulder, not elsewhere classified: Secondary | ICD-10-CM | POA: Diagnosis not present

## 2018-12-02 DIAGNOSIS — M75122 Complete rotator cuff tear or rupture of left shoulder, not specified as traumatic: Secondary | ICD-10-CM | POA: Diagnosis not present

## 2018-12-02 DIAGNOSIS — M25512 Pain in left shoulder: Secondary | ICD-10-CM | POA: Diagnosis not present

## 2018-12-02 DIAGNOSIS — M6281 Muscle weakness (generalized): Secondary | ICD-10-CM | POA: Diagnosis not present

## 2018-12-09 DIAGNOSIS — M6281 Muscle weakness (generalized): Secondary | ICD-10-CM | POA: Diagnosis not present

## 2018-12-09 DIAGNOSIS — M75122 Complete rotator cuff tear or rupture of left shoulder, not specified as traumatic: Secondary | ICD-10-CM | POA: Diagnosis not present

## 2018-12-09 DIAGNOSIS — M25612 Stiffness of left shoulder, not elsewhere classified: Secondary | ICD-10-CM | POA: Diagnosis not present

## 2018-12-09 DIAGNOSIS — M25512 Pain in left shoulder: Secondary | ICD-10-CM | POA: Diagnosis not present

## 2018-12-16 DIAGNOSIS — M6281 Muscle weakness (generalized): Secondary | ICD-10-CM | POA: Diagnosis not present

## 2018-12-16 DIAGNOSIS — M25612 Stiffness of left shoulder, not elsewhere classified: Secondary | ICD-10-CM | POA: Diagnosis not present

## 2018-12-16 DIAGNOSIS — M75122 Complete rotator cuff tear or rupture of left shoulder, not specified as traumatic: Secondary | ICD-10-CM | POA: Diagnosis not present

## 2018-12-16 DIAGNOSIS — M25512 Pain in left shoulder: Secondary | ICD-10-CM | POA: Diagnosis not present

## 2018-12-23 DIAGNOSIS — E669 Obesity, unspecified: Secondary | ICD-10-CM | POA: Diagnosis not present

## 2018-12-23 DIAGNOSIS — M25612 Stiffness of left shoulder, not elsewhere classified: Secondary | ICD-10-CM | POA: Diagnosis not present

## 2018-12-23 DIAGNOSIS — M6281 Muscle weakness (generalized): Secondary | ICD-10-CM | POA: Diagnosis not present

## 2018-12-23 DIAGNOSIS — M75100 Unspecified rotator cuff tear or rupture of unspecified shoulder, not specified as traumatic: Secondary | ICD-10-CM | POA: Diagnosis not present

## 2018-12-23 DIAGNOSIS — M25512 Pain in left shoulder: Secondary | ICD-10-CM | POA: Diagnosis not present

## 2018-12-23 DIAGNOSIS — Z6836 Body mass index (BMI) 36.0-36.9, adult: Secondary | ICD-10-CM | POA: Diagnosis not present

## 2018-12-23 DIAGNOSIS — M75122 Complete rotator cuff tear or rupture of left shoulder, not specified as traumatic: Secondary | ICD-10-CM | POA: Diagnosis not present

## 2018-12-23 DIAGNOSIS — R5382 Chronic fatigue, unspecified: Secondary | ICD-10-CM | POA: Diagnosis not present

## 2018-12-28 DIAGNOSIS — M25512 Pain in left shoulder: Secondary | ICD-10-CM | POA: Diagnosis not present

## 2018-12-31 DIAGNOSIS — R030 Elevated blood-pressure reading, without diagnosis of hypertension: Secondary | ICD-10-CM | POA: Diagnosis not present

## 2018-12-31 DIAGNOSIS — Z20828 Contact with and (suspected) exposure to other viral communicable diseases: Secondary | ICD-10-CM | POA: Diagnosis not present

## 2019-02-15 DIAGNOSIS — M75122 Complete rotator cuff tear or rupture of left shoulder, not specified as traumatic: Secondary | ICD-10-CM | POA: Diagnosis not present

## 2019-02-26 DIAGNOSIS — Z0000 Encounter for general adult medical examination without abnormal findings: Secondary | ICD-10-CM | POA: Diagnosis not present

## 2019-02-26 DIAGNOSIS — Z13220 Encounter for screening for lipoid disorders: Secondary | ICD-10-CM | POA: Diagnosis not present

## 2019-02-26 DIAGNOSIS — Z5181 Encounter for therapeutic drug level monitoring: Secondary | ICD-10-CM | POA: Diagnosis not present

## 2019-02-26 DIAGNOSIS — R5383 Other fatigue: Secondary | ICD-10-CM | POA: Diagnosis not present

## 2019-03-01 DIAGNOSIS — Z6836 Body mass index (BMI) 36.0-36.9, adult: Secondary | ICD-10-CM | POA: Diagnosis not present

## 2019-03-01 DIAGNOSIS — E669 Obesity, unspecified: Secondary | ICD-10-CM | POA: Diagnosis not present

## 2019-03-01 DIAGNOSIS — Z0001 Encounter for general adult medical examination with abnormal findings: Secondary | ICD-10-CM | POA: Diagnosis not present

## 2019-03-01 DIAGNOSIS — R5382 Chronic fatigue, unspecified: Secondary | ICD-10-CM | POA: Diagnosis not present

## 2019-03-01 DIAGNOSIS — E785 Hyperlipidemia, unspecified: Secondary | ICD-10-CM | POA: Diagnosis not present

## 2019-03-01 DIAGNOSIS — R319 Hematuria, unspecified: Secondary | ICD-10-CM | POA: Diagnosis not present

## 2019-03-19 DIAGNOSIS — I10 Essential (primary) hypertension: Secondary | ICD-10-CM | POA: Diagnosis not present

## 2019-03-19 DIAGNOSIS — R5382 Chronic fatigue, unspecified: Secondary | ICD-10-CM | POA: Diagnosis not present

## 2019-03-19 DIAGNOSIS — Z6835 Body mass index (BMI) 35.0-35.9, adult: Secondary | ICD-10-CM | POA: Diagnosis not present

## 2019-04-02 DIAGNOSIS — I10 Essential (primary) hypertension: Secondary | ICD-10-CM | POA: Diagnosis not present

## 2019-04-02 DIAGNOSIS — Z6836 Body mass index (BMI) 36.0-36.9, adult: Secondary | ICD-10-CM | POA: Diagnosis not present

## 2019-04-02 DIAGNOSIS — M19011 Primary osteoarthritis, right shoulder: Secondary | ICD-10-CM | POA: Diagnosis not present

## 2019-04-02 DIAGNOSIS — R5382 Chronic fatigue, unspecified: Secondary | ICD-10-CM | POA: Diagnosis not present

## 2019-05-03 DIAGNOSIS — Z23 Encounter for immunization: Secondary | ICD-10-CM | POA: Diagnosis not present

## 2019-06-09 DIAGNOSIS — I10 Essential (primary) hypertension: Secondary | ICD-10-CM | POA: Diagnosis not present

## 2019-06-09 DIAGNOSIS — Z6836 Body mass index (BMI) 36.0-36.9, adult: Secondary | ICD-10-CM | POA: Diagnosis not present

## 2019-06-09 DIAGNOSIS — L723 Sebaceous cyst: Secondary | ICD-10-CM | POA: Diagnosis not present

## 2019-06-09 DIAGNOSIS — E669 Obesity, unspecified: Secondary | ICD-10-CM | POA: Diagnosis not present

## 2019-06-09 DIAGNOSIS — R05 Cough: Secondary | ICD-10-CM | POA: Diagnosis not present

## 2019-06-16 DIAGNOSIS — M75121 Complete rotator cuff tear or rupture of right shoulder, not specified as traumatic: Secondary | ICD-10-CM | POA: Diagnosis not present

## 2019-06-16 DIAGNOSIS — M25511 Pain in right shoulder: Secondary | ICD-10-CM | POA: Diagnosis not present
